# Patient Record
Sex: Female | Born: 1935
Health system: Southern US, Community
[De-identification: ages and names within clinical notes are randomized; demographics above are authoritative.]

## PROBLEM LIST (undated history)

## (undated) DIAGNOSIS — K921 Melena: Secondary | ICD-10-CM

## (undated) DIAGNOSIS — R0989 Other specified symptoms and signs involving the circulatory and respiratory systems: Secondary | ICD-10-CM

## (undated) DIAGNOSIS — I10 Essential (primary) hypertension: Secondary | ICD-10-CM

## (undated) DIAGNOSIS — E119 Type 2 diabetes mellitus without complications: Secondary | ICD-10-CM

## (undated) DIAGNOSIS — H353290 Exudative age-related macular degeneration, unspecified eye, stage unspecified: Secondary | ICD-10-CM

## (undated) DIAGNOSIS — M8580 Other specified disorders of bone density and structure, unspecified site: Secondary | ICD-10-CM

## (undated) DIAGNOSIS — F329 Major depressive disorder, single episode, unspecified: Secondary | ICD-10-CM

## (undated) DIAGNOSIS — I672 Cerebral atherosclerosis: Secondary | ICD-10-CM

## (undated) DIAGNOSIS — G459 Transient cerebral ischemic attack, unspecified: Secondary | ICD-10-CM

## (undated) DIAGNOSIS — K2900 Acute gastritis without bleeding: Secondary | ICD-10-CM

## (undated) DIAGNOSIS — R079 Chest pain, unspecified: Secondary | ICD-10-CM

## (undated) DIAGNOSIS — E785 Hyperlipidemia, unspecified: Secondary | ICD-10-CM

## (undated) DIAGNOSIS — H3561 Retinal hemorrhage, right eye: Secondary | ICD-10-CM

## (undated) HISTORY — PX: GALLBLADDER SURGERY: SHX652

## (undated) HISTORY — DX: Major depressive disorder, single episode, unspecified: F32.9

## (undated) HISTORY — DX: Exudative age-related macular degeneration, unspecified eye, stage unspecified: H35.3290

## (undated) HISTORY — PX: CATARACT EXTRACTION: SUR2

## (undated) HISTORY — DX: Hyperlipidemia, unspecified: E78.5

## (undated) HISTORY — DX: Melena: K92.1

## (undated) HISTORY — DX: Cerebral atherosclerosis: I67.2

## (undated) HISTORY — DX: Retinal hemorrhage, right eye: H35.61

## (undated) HISTORY — DX: Other specified symptoms and signs involving the circulatory and respiratory systems: R09.89

## (undated) HISTORY — PX: EYE SURGERY: SHX253

## (undated) HISTORY — DX: Type 2 diabetes mellitus without complications: E11.9

## (undated) HISTORY — DX: Acute gastritis without bleeding: K29.00

## (undated) HISTORY — DX: Chest pain, unspecified: R07.9

## (undated) HISTORY — DX: Other specified disorders of bone density and structure, unspecified site: M85.80

## (undated) HISTORY — DX: Essential (primary) hypertension: I10

---
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
## 1983-08-15 HISTORY — PX: DILATION AND CURETTAGE OF UTERUS: SHX78

## 2001-06-28 ENCOUNTER — Other Ambulatory Visit: Admission: RE | Admit: 2001-06-28 | Discharge: 2001-06-28 | Payer: Self-pay | Admitting: Obstetrics and Gynecology

## 2002-07-07 ENCOUNTER — Other Ambulatory Visit: Admission: RE | Admit: 2002-07-07 | Discharge: 2002-07-07 | Payer: Self-pay | Admitting: Obstetrics and Gynecology

## 2004-08-23 ENCOUNTER — Other Ambulatory Visit: Admission: RE | Admit: 2004-08-23 | Discharge: 2004-08-23 | Payer: Self-pay | Admitting: Obstetrics and Gynecology

## 2004-11-29 ENCOUNTER — Emergency Department (HOSPITAL_COMMUNITY): Admission: EM | Admit: 2004-11-29 | Discharge: 2004-11-29 | Payer: Self-pay | Admitting: Emergency Medicine

## 2005-01-31 ENCOUNTER — Encounter: Admission: RE | Admit: 2005-01-31 | Discharge: 2005-01-31 | Payer: Self-pay | Admitting: General Surgery

## 2005-03-15 ENCOUNTER — Encounter (INDEPENDENT_AMBULATORY_CARE_PROVIDER_SITE_OTHER): Payer: Self-pay | Admitting: *Deleted

## 2005-03-15 ENCOUNTER — Ambulatory Visit (HOSPITAL_COMMUNITY): Admission: RE | Admit: 2005-03-15 | Discharge: 2005-03-15 | Payer: Self-pay | Admitting: General Surgery

## 2005-06-14 ENCOUNTER — Ambulatory Visit (HOSPITAL_COMMUNITY): Admission: RE | Admit: 2005-06-14 | Discharge: 2005-06-14 | Payer: Self-pay | Admitting: Gastroenterology

## 2007-11-29 DIAGNOSIS — R079 Chest pain, unspecified: Secondary | ICD-10-CM

## 2007-11-29 HISTORY — DX: Chest pain, unspecified: R07.9

## 2010-07-12 ENCOUNTER — Encounter
Admission: RE | Admit: 2010-07-12 | Discharge: 2010-07-22 | Payer: Self-pay | Source: Home / Self Care | Attending: Family Medicine | Admitting: Family Medicine

## 2010-12-30 NOTE — Op Note (Signed)
NAME:  Sabrina Hooper, Sabrina Hooper                  ACCOUNT NO.:  [id number]   MEDICAL RECORD NO.:  [id number]          PATIENT TYPE:  AMB   LOCATION:  ENDO                         FACILITY:  [facility]   PHYSICIAN:  James L. Malon Kindle., M.D.DATE OF BIRTH:  July 24, 1933   DATE OF PROCEDURE:  06/14/2005  DATE OF DISCHARGE:                                 OPERATIVE REPORT   PROCEDURE:  Colonoscopy.   MEDICATIONS:  The patient received fentanyl 75 mg and Versed 8 mg for both  procedures.   INDICATIONS FOR PROCEDURE:  Heme positive stools.   SCOPE:  Pediatric adjustable scope.   DESCRIPTION OF PROCEDURE:  Following the endoscopy, the patient was turned  around and the Olympus adjustable pediatric colonoscope was inserted  following a digital exam and advanced. The prep was excellent. We were able  to easily reach the cecum. The ileocecal valve and appendiceal orifice were  seen. Slight abdominal pressure and placing the patient in the supine  position was required. The scope was withdrawn, mucosa carefully examined.  The entire colon was seen well upon withdrawal, no polyps were seen, normal  mucosal pattern, no significant diverticular disease. The scope was  withdrawn, the patient tolerated the procedure well.   ASSESSMENT:  Heme positive stool, negative colonoscopy at this time, 792.1.   PLAN:  Will recommend yearly Hemoccult and possibly repeat colonoscopy in 10  years.           ______________________________  Llana Aliment Malon Kindle., M.D.     Waldron Session  D:  06/14/2005  T:  06/14/2005  Job:  [id number]   cc:   Thelma Barge P. Modesto Charon, M.D.  Fax: [phone]

## 2010-12-30 NOTE — Op Note (Signed)
[name], Hooper NO.:  [id number]   MEDICAL RECORD NO.:  [id number]          PATIENT TYPE:  AMB   LOCATION:  DAY                          FACILITY:  [facility]   PHYSICIAN:  Leonie Man, M.D.   DATE OF BIRTH:  07-16-1933   DATE OF PROCEDURE:  03/15/2005  DATE OF DISCHARGE:                                 OPERATIVE REPORT   PREOPERATIVE DIAGNOSIS:  Chronic calculous cholecystitis.   POSTOPERATIVE DIAGNOSIS:  Chronic calculous cholecystitis.   PROCEDURE:  Laparoscopic cholecystectomy with intraoperative cholangiogram.   SURGEON:  Dr. Leonie Man.   ASSISTANT:  Dr. Lovie Chol.   ANESTHESIA:  General.   INDICATIONS FOR PROCEDURE:  The patient is a 77 year old female presenting  with recurrent symptoms of upper abdominal pain, no associated nausea and  vomiting. She on ultrasound was noted to have multiple large gallstones  within the gallbladder. These had been confirmed some years ago and on  repeat ultrasound, she is noted to have stones. Her liver function studies  are within normal limits.   The patient comes to the operating room now after the risks and potential  benefits of surgery have been discussed. All of her questions have been  answered, consent obtained for surgery.   DESCRIPTION OF PROCEDURE:  Following the induction of satisfactory general  anesthesia, the patient positioned supinely, the abdomen is prepped and  draped routinely, open laparoscopy created at the umbilicus with  insufflation of the peritoneal cavity to 14 mmHg pressure through a Hassan  cannula. The camera was inserted and visual exploration of the abdomen  carried out. Liver edges sharp, liver surfaces smooth. The anterior gastric  wall and duodenal sweep appeared to be normal. The gallbladder was  chronically scarred. The pelvic organs are not visualized.  The portions of  the large and small intestine visualized appeared nornal. Under direct  vision,  epigastric and flank ports are placed. The gallbladder is grasped  and retracted cephalad and dissection carried out in the region of the  ampulla with isolation of the cystic artery and cystic duct. The cystic duct  is traced up to the cystic duct gallbladder junction and then clipped  proximally and opened. A cystic duct cholangiogram is then carried out using  a Cook catheter which is inserted into the cystic duct and injected with one-  half strength Hypaque under fluoroscopic control. The resulting  cholangiogram showed prompt flow of contrast into the duodenum. There were  no filling defects in the common bile duct, hepatic duct, or any of the  hepatic radicals. The cystic duct catheter is removed and the cystic duct is  triply clipped and transected. The cystic artery is isolated and doubly  clipped and transected. The gallbladder dissected free from the liver bed  using electrocautery with a maintenance of hemostasis throughout the entire  course of the dissection. At the end of dissection, the gallbladder is  placed in an EndoCatch and retrieved through the umbilicus without  difficulty. The right upper quadrant is thoroughly irrigated with normal  saline and aspirated free. All areas of dissection checked  for hemostasis  and noted to be dry. Sponge, instrument and sharp counts verified. The  pneumoperitoneum allowed to deflate after trocars removed under direct  vision. Then the wounds are then closed in layers as follows:  Umbilical  wound in two layers with 0 Vicryl and 4-0 Monocryl,  epigastric and lateral flank wounds closed with 4-0 Monocryl. All wounds  reinforced with Steri-Strips and sterile dressings were applied, anesthetic  reversed. The patient removed from the operating room to the recovery room  in stable condition.       PB/MEDQ  D:  03/15/2005  T:  03/15/2005  Job:  [id number]   cc:   Maryla Morrow. Modesto Charon, M.D.  [address]  [place]  [place] [address]  Fax:  [phone]

## 2010-12-30 NOTE — Op Note (Signed)
NAME:  Sabrina Hooper, Sabrina Hooper                  ACCOUNT NO.:  [id number]   MEDICAL RECORD NO.:  [id number]          PATIENT TYPE:  AMB   LOCATION:  ENDO                         FACILITY:  [facility]   PHYSICIAN:  James L. Malon Kindle., M.D.DATE OF BIRTH:  March 27, 1933   DATE OF PROCEDURE:  06/14/2005  DATE OF DISCHARGE:                                 OPERATIVE REPORT   PROCEDURE:  Esophagogastroduodenoscopy with biopsy.   MEDICATIONS:  Please see nurse's notes, medications are not recorded on the  doctor's information sheet and separated out for both procedures.   INDICATIONS FOR PROCEDURE:  Heme positive stool.   DESCRIPTION OF PROCEDURE:  The procedure had been explained to the patient  and consent obtained. With the patient in the left lateral decubitus  position, each scope was inserted in the mouth and passed in the esophagus  with agglutination. The stomach was entered, pylorus identified and passed.  The duodenum including the bulb and second portion were seen well and was  unremarkable. The scope was withdrawn back in the stomach, mild antral  gastritis with a few erosions. Biopsy obtained for rapid urease test for  Helicobacter. No other abnormality seen. The distal and proximal esophagus  endoscopically normal. The scope withdrawn. The patient tolerated the  procedure well.   ASSESSMENT:  1.  Gastritis possibly the source of her heme positive stool, 535.40.  2.  Heme positive stool, 792.1.   PLAN:  Will recommend Prilosec OTC 20 mg daily. Will see back in the office  in 2 months.           ______________________________  Llana Aliment. Malon Kindle., M.D.     Waldron Session  D:  06/14/2005  T:  06/14/2005  Job:  [id number]   cc:   Thelma Barge P. Modesto Charon, M.D.  Fax: [phone]

## 2011-10-24 DIAGNOSIS — I672 Cerebral atherosclerosis: Secondary | ICD-10-CM

## 2011-10-24 HISTORY — DX: Cerebral atherosclerosis: I67.2

## 2012-11-19 ENCOUNTER — Encounter: Payer: Self-pay | Admitting: Pharmacist Clinician (PhC)/ Clinical Pharmacy Specialist

## 2012-11-22 ENCOUNTER — Encounter: Payer: Self-pay | Admitting: Cardiovascular Disease

## 2012-12-25 ENCOUNTER — Ambulatory Visit: Payer: Self-pay | Admitting: Cardiovascular Disease

## 2013-02-13 ENCOUNTER — Ambulatory Visit: Payer: Self-pay | Admitting: Cardiovascular Disease

## 2013-02-13 ENCOUNTER — Ambulatory Visit (INDEPENDENT_AMBULATORY_CARE_PROVIDER_SITE_OTHER): Payer: Medicare Other | Admitting: Cardiovascular Disease

## 2013-02-13 ENCOUNTER — Encounter: Payer: Self-pay | Admitting: Cardiovascular Disease

## 2013-02-13 VITALS — BP 126/64 | HR 77 | Ht 65.0 in | Wt 168.0 lb

## 2013-02-13 DIAGNOSIS — E785 Hyperlipidemia, unspecified: Secondary | ICD-10-CM | POA: Insufficient documentation

## 2013-02-13 DIAGNOSIS — E119 Type 2 diabetes mellitus without complications: Secondary | ICD-10-CM | POA: Insufficient documentation

## 2013-02-13 DIAGNOSIS — I10 Essential (primary) hypertension: Secondary | ICD-10-CM | POA: Insufficient documentation

## 2013-02-13 NOTE — Progress Notes (Signed)
02/13/2013 Sabrina Hooper   October 11, 1932  [id number]  Primary Physician MCNEILL,WENDY, MD Primary Cardiologist: Runell Gess MD Sabrina Hooper   HPI:  The patient is a very pleasant, 79 year old, mildly overweight, married Caucasian female whose husband Renae Gloss is also a patient of mine. She is a mother of 2, grandmother to 3 grandchildren. I last saw her August 23, 2010. Her risk factors include hypertension, hyperlipidemia, and noninsulin-requiring diabetes. She denies chest pain or shortness of breath. She had a negative Myoview April 2009. Recent blood work performed by Dr. Carmon Ginsberg revealed total cholesterol 134, LDL 63 and HDL of 62. She had carotid Dopplers performed in our office 10/24/11 which revealed only mild bilateral ICA stenosis, unchanged from the prior study. Since I last saw her 11/20/11 she had no symptoms. Dr. Uvaldo Rising, her PCP, follows her laboratory exam including lipid profile     Current Outpatient Prescriptions  Medication Sig Dispense Refill  . calcium carbonate (OS-CAL) 600 MG TABS Take 600 mg by mouth 2 (two) times daily with a meal.      . citalopram (CELEXA) 20 MG tablet Take 20 mg by mouth daily.      Marland Kitchen glipiZIDE (GLUCOTROL) 5 MG tablet Take 5 mg by mouth daily.      . hydrochlorothiazide (HYDRODIURIL) 25 MG tablet Take 25 mg by mouth daily.      Marland Kitchen losartan (COZAAR) 50 MG tablet Take 50 mg by mouth daily.      . metFORMIN (GLUCOPHAGE) 500 MG tablet Take 500 mg by mouth 2 (two) times daily with a meal.      . Multiple Vitamins-Minerals (PRESERVISION AREDS) TABS Take 2 tablets by mouth daily.      Marland Kitchen omeprazole (PRILOSEC) 20 MG capsule Take 20 mg by mouth daily.      . simvastatin (ZOCOR) 10 MG tablet Take 10 mg by mouth at bedtime.       No current facility-administered medications for this visit.    Allergies  Allergen Reactions  . Codeine Nausea And Vomiting and Other (See Comments)    GI upset    History   Social History  . Marital Status: Single     Spouse Name: N/A    Number of Children: N/A  . Years of Education: N/A   Occupational History  . Not on file.   Social History Main Topics  . Smoking status: Never Smoker   . Smokeless tobacco: Never Used  . Alcohol Use: No  . Drug Use: No  . Sexually Active: Not on file   Other Topics Concern  . Not on file   Social History Narrative  . No narrative on file     Review of Systems: General: negative for chills, fever, night sweats or weight changes.  Cardiovascular: negative for chest pain, dyspnea on exertion, edema, orthopnea, palpitations, paroxysmal nocturnal dyspnea or shortness of breath Dermatological: negative for rash Respiratory: negative for cough or wheezing Urologic: negative for hematuria Abdominal: negative for nausea, vomiting, diarrhea, bright red blood per rectum, melena, or hematemesis Neurologic: negative for visual changes, syncope, or dizziness All other systems reviewed and are otherwise negative except as noted above.    Blood pressure 126/64, pulse 77, height 5\' 5"  (1.651 m), weight 168 lb (76.204 kg).  General appearance: alert and no distress Neck: no adenopathy, no carotid bruit, no JVD, supple, symmetrical, trachea midline and thyroid not enlarged, symmetric, no tenderness/mass/nodules Lungs: clear to auscultation bilaterally Heart: regular rate and rhythm, S1, S2 normal, no murmur,  click, rub or gallop Extremities: extremities normal, atraumatic, no cyanosis or edema  EKG normal sinus rhythm at 77 without ST or T wave changes  ASSESSMENT AND PLAN:   Essential hypertension Well-controlled on current medications  Hyperlipidemia On statin therapy followed by primary care physician      Runell Gess MD [facility], [facility] 02/13/2013 11:18 AM

## 2013-02-13 NOTE — Assessment & Plan Note (Signed)
Well-controlled on current medications 

## 2013-02-13 NOTE — Patient Instructions (Signed)
Your physician wants you to follow-up in: 1 year. You will receive a reminder letter in the mail two months in advance. If you don't receive a letter, please call our office to schedule the follow-up appointment.  

## 2013-02-13 NOTE — Assessment & Plan Note (Signed)
On statin therapy followed by primary care physician

## 2013-10-23 ENCOUNTER — Telehealth (HOSPITAL_COMMUNITY): Payer: Self-pay | Admitting: *Deleted

## 2013-10-27 ENCOUNTER — Other Ambulatory Visit (HOSPITAL_COMMUNITY): Payer: Self-pay | Admitting: Cardiovascular Disease

## 2013-10-27 DIAGNOSIS — I6529 Occlusion and stenosis of unspecified carotid artery: Secondary | ICD-10-CM

## 2013-11-05 ENCOUNTER — Ambulatory Visit (HOSPITAL_COMMUNITY)
Admission: RE | Admit: 2013-11-05 | Discharge: 2013-11-05 | Disposition: A | Discharge status: Home / Self Care | Payer: Medicare Other | Source: Ambulatory Visit | Attending: Cardiovascular Disease | Admitting: Cardiovascular Disease

## 2013-11-05 DIAGNOSIS — I6529 Occlusion and stenosis of unspecified carotid artery: Secondary | ICD-10-CM

## 2013-11-05 NOTE — Progress Notes (Signed)
Carotid Duplex Completed. Ketsia Linebaugh, BS, RDMS, RVT  

## 2013-11-11 ENCOUNTER — Encounter: Payer: Self-pay | Admitting: *Deleted

## 2014-03-28 ENCOUNTER — Emergency Department (HOSPITAL_BASED_OUTPATIENT_CLINIC_OR_DEPARTMENT_OTHER): Payer: Medicare Other

## 2014-03-28 ENCOUNTER — Emergency Department (HOSPITAL_BASED_OUTPATIENT_CLINIC_OR_DEPARTMENT_OTHER)
Admission: EM | Admit: 2014-03-28 | Discharge: 2014-03-28 | Disposition: A | Discharge status: Home / Self Care | Payer: Medicare Other | Attending: Emergency Medicine | Admitting: Emergency Medicine

## 2014-03-28 ENCOUNTER — Encounter (HOSPITAL_BASED_OUTPATIENT_CLINIC_OR_DEPARTMENT_OTHER): Payer: Self-pay | Admitting: Emergency Medicine

## 2014-03-28 DIAGNOSIS — S6990XA Unspecified injury of unspecified wrist, hand and finger(s), initial encounter: Secondary | ICD-10-CM | POA: Diagnosis not present

## 2014-03-28 DIAGNOSIS — W1809XA Striking against other object with subsequent fall, initial encounter: Secondary | ICD-10-CM | POA: Diagnosis not present

## 2014-03-28 DIAGNOSIS — S1093XA Contusion of unspecified part of neck, initial encounter: Principal | ICD-10-CM

## 2014-03-28 DIAGNOSIS — S4980XA Other specified injuries of shoulder and upper arm, unspecified arm, initial encounter: Secondary | ICD-10-CM | POA: Insufficient documentation

## 2014-03-28 DIAGNOSIS — S6980XA Other specified injuries of unspecified wrist, hand and finger(s), initial encounter: Secondary | ICD-10-CM | POA: Diagnosis not present

## 2014-03-28 DIAGNOSIS — S0003XA Contusion of scalp, initial encounter: Secondary | ICD-10-CM | POA: Diagnosis not present

## 2014-03-28 DIAGNOSIS — Y929 Unspecified place or not applicable: Secondary | ICD-10-CM | POA: Insufficient documentation

## 2014-03-28 DIAGNOSIS — Z8673 Personal history of transient ischemic attack (TIA), and cerebral infarction without residual deficits: Secondary | ICD-10-CM | POA: Diagnosis not present

## 2014-03-28 DIAGNOSIS — Z79899 Other long term (current) drug therapy: Secondary | ICD-10-CM | POA: Insufficient documentation

## 2014-03-28 DIAGNOSIS — I10 Essential (primary) hypertension: Secondary | ICD-10-CM | POA: Insufficient documentation

## 2014-03-28 DIAGNOSIS — S0083XA Contusion of other part of head, initial encounter: Principal | ICD-10-CM | POA: Insufficient documentation

## 2014-03-28 DIAGNOSIS — Y9389 Activity, other specified: Secondary | ICD-10-CM | POA: Diagnosis not present

## 2014-03-28 DIAGNOSIS — IMO0002 Reserved for concepts with insufficient information to code with codable children: Secondary | ICD-10-CM | POA: Diagnosis present

## 2014-03-28 DIAGNOSIS — E119 Type 2 diabetes mellitus without complications: Secondary | ICD-10-CM | POA: Insufficient documentation

## 2014-03-28 DIAGNOSIS — S0081XA Abrasion of other part of head, initial encounter: Secondary | ICD-10-CM

## 2014-03-28 DIAGNOSIS — S46909A Unspecified injury of unspecified muscle, fascia and tendon at shoulder and upper arm level, unspecified arm, initial encounter: Secondary | ICD-10-CM | POA: Diagnosis not present

## 2014-03-28 DIAGNOSIS — E785 Hyperlipidemia, unspecified: Secondary | ICD-10-CM | POA: Insufficient documentation

## 2014-03-28 DIAGNOSIS — M79644 Pain in right finger(s): Secondary | ICD-10-CM

## 2014-03-28 DIAGNOSIS — W19XXXA Unspecified fall, initial encounter: Secondary | ICD-10-CM

## 2014-03-28 NOTE — Discharge Instructions (Signed)
Please wear splint and have thumb rechecked if continued pain next week.    Abrasion An abrasion is a cut or scrape of the skin. Abrasions do not extend through all layers of the skin and most heal within 10 days. It is important to care for your abrasion properly to prevent infection. CAUSES  Most abrasions are caused by falling on, or gliding across, the ground or other surface. When your skin rubs on something, the outer and inner layer of skin rubs off, causing an abrasion. DIAGNOSIS  Your caregiver will be able to diagnose an abrasion during a physical exam.  TREATMENT  Your treatment depends on how large and deep the abrasion is. Generally, your abrasion will be cleaned with water and a mild soap to remove any dirt or debris. An antibiotic ointment may be put over the abrasion to prevent an infection. A bandage (dressing) may be wrapped around the abrasion to keep it from getting dirty.  You may need a tetanus shot if:  You cannot remember when you had your last tetanus shot.  You have never had a tetanus shot.  The injury broke your skin. If you get a tetanus shot, your arm may swell, get red, and feel warm to the touch. This is common and not a problem. If you need a tetanus shot and you choose not to have one, there is a rare chance of getting tetanus. Sickness from tetanus can be serious.  HOME CARE INSTRUCTIONS   If a dressing was applied, change it at least once a day or as directed by your caregiver. If the bandage sticks, soak it off with warm water.   Wash the area with water and a mild soap to remove all the ointment 2 times a day. Rinse off the soap and pat the area dry with a clean towel.   Reapply any ointment as directed by your caregiver. This will help prevent infection and keep the bandage from sticking. Use gauze over the wound and under the dressing to help keep the bandage from sticking.   Change your dressing right away if it becomes wet or dirty.   Only  take over-the-counter or prescription medicines for pain, discomfort, or fever as directed by your caregiver.   Follow up with your caregiver within 24-48 hours for a wound check, or as directed. If you were not given a wound-check appointment, look closely at your abrasion for redness, swelling, or pus. These are signs of infection. SEEK IMMEDIATE MEDICAL CARE IF:   You have increasing pain in the wound.   You have redness, swelling, or tenderness around the wound.   You have pus coming from the wound.   You have a fever or persistent symptoms for more than 2-3 days.  You have a fever and your symptoms suddenly get worse.  You have a bad smell coming from the wound or dressing.  MAKE SURE YOU:   Understand these instructions.  Will watch your condition.  Will get help right away if you are not doing well or get worse. Document Released: 05/10/2005 Document Revised: 07/17/2012 Document Reviewed: 07/04/2011 [facility] Patient Information 2015 [place], [place]. This information is not intended to replace advice given to you by your health care provider. Make sure you discuss any questions you have with your health care provider. Fall Prevention and Home Safety Falls cause injuries and can affect all age groups. It is possible to use preventive measures to significantly decrease the likelihood of falls. There are many  simple measures which can make your home safer and prevent falls. OUTDOORS  Repair cracks and edges of walkways and driveways.  Remove high doorway thresholds.  Trim shrubbery on the main path into your home.  Have good outside lighting.  Clear walkways of tools, rocks, debris, and clutter.  Check that handrails are not broken and are securely fastened. Both sides of steps should have handrails.  Have leaves, snow, and ice cleared regularly.  Use sand or salt on walkways during winter months.  In the garage, clean up grease or oil spills. BATHROOM  Install  night lights.  Install grab bars by the toilet and in the tub and shower.  Use non-skid mats or decals in the tub or shower.  Place a plastic non-slip stool in the shower to sit on, if needed.  Keep floors dry and clean up all water on the floor immediately.  Remove soap buildup in the tub or shower on a regular basis.  Secure bath mats with non-slip, double-sided rug tape.  Remove throw rugs and tripping hazards from the floors. BEDROOMS  Install night lights.  Make sure a bedside light is easy to reach.  Do not use oversized bedding.  Keep a telephone by your bedside.  Have a firm chair with side arms to use for getting dressed.  Remove throw rugs and tripping hazards from the floor. KITCHEN  Keep handles on pots and pans turned toward the center of the stove. Use back burners when possible.  Clean up spills quickly and allow time for drying.  Avoid walking on wet floors.  Avoid hot utensils and knives.  Position shelves so they are not too high or low.  Place commonly used objects within easy reach.  If necessary, use a sturdy step stool with a grab bar when reaching.  Keep electrical cables out of the way.  Do not use floor polish or wax that makes floors slippery. If you must use wax, use non-skid floor wax.  Remove throw rugs and tripping hazards from the floor. STAIRWAYS  Never leave objects on stairs.  Place handrails on both sides of stairways and use them. Fix any loose handrails. Make sure handrails on both sides of the stairways are as long as the stairs.  Check carpeting to make sure it is firmly attached along stairs. Make repairs to worn or loose carpet promptly.  Avoid placing throw rugs at the top or bottom of stairways, or properly secure the rug with carpet tape to prevent slippage. Get rid of throw rugs, if possible.  Have an electrician put in a light switch at the top and bottom of the stairs. OTHER FALL PREVENTION TIPS  Wear  low-heel or rubber-soled shoes that are supportive and fit well. Wear closed toe shoes.  When using a stepladder, make sure it is fully opened and both spreaders are firmly locked. Do not climb a closed stepladder.  Add color or contrast paint or tape to grab bars and handrails in your home. Place contrasting color strips on first and last steps.  Learn and use mobility aids as needed. Install an electrical emergency response system.  Turn on lights to avoid dark areas. Replace light bulbs that burn out immediately. Get light switches that glow.  Arrange furniture to create clear pathways. Keep furniture in the same place.  Firmly attach carpet with non-skid or double-sided tape.  Eliminate uneven floor surfaces.  Select a carpet pattern that does not visually hide the edge of steps.  Be  aware of all pets. OTHER HOME SAFETY TIPS  Set the water temperature for 120 F (48.8 C).  Keep emergency numbers on or near the telephone.  Keep smoke detectors on every level of the home and near sleeping areas. Document Released: 07/21/2002 Document Revised: 01/30/2012 Document Reviewed: 10/20/2011 [facility] Patient Information 2015 [place], [place]. This information is not intended to replace advice given to you by your health care provider. Make sure you discuss any questions you have with your health care provider.

## 2014-03-28 NOTE — ED Notes (Signed)
Pt was dipping water out of a pool and lost her balance. When she fell, she struck her left face on a brick. Pt also c/o pain in her right arm from the upper arm down to her thumb. Pt denies LOC.

## 2014-03-28 NOTE — ED Notes (Addendum)
I completed wound care over laceration on cheek and facial abrasions. I used normal saline and "sure-clens". I carefully removed oil film from intact skin over and under laceration with gentle wipe of alcohol pad to insure steri-strip adhesion. I then applied three small steri-strips to complete.

## 2014-03-28 NOTE — ED Notes (Signed)
I was told by nurse Fannie KneeSue to apply a velcro-thumb spica device. Patient stated her comfort was greatly improved by device. Patient is dressing and ready for discharge.

## 2014-03-28 NOTE — ED Provider Notes (Signed)
CSN: [id number]     Arrival date & time 03/28/14  1759 History  This chart was scribed for Hilario Quarry, MD by Evon Slack, ED Scribe. This patient was seen in room MH12/MH12 and the patient's care was started at 6:15 PM.    Chief Complaint  Patient presents with  . Fall   Patient is a 80 y.o. female presenting with fall. The history is provided by the patient. No language interpreter was used.  Fall This is a new problem. The current episode started less than 1 hour ago. The problem occurs rarely. The problem has not changed since onset.Nothing aggravates the symptoms. Nothing relieves the symptoms. She has tried a cold compress for the symptoms.   HPI Comments: Sabrina Hooper is a 80 y.o. female who presents to the Emergency Department complaining of Fall onset PTA. She has associated right arm pain, facial pain and facial abrasion. She denies LOC. She states she was pouring water out when she lost her balance and fell and hit her head on a brick. She states she may have hurt her arm on the concrete when she fell. She states there was no witness to the event. She states that her tetanus is UTD   Past Medical History  Diagnosis Date  . Chest pain, unspecified 11/29/2007    R/S MV - EF 94%; normal perfusion in all regions, global LV function normal, EKG negative for ischemia;   . Cerebral atherosclerosis 10/24/2011    Doppler - bilateral bulb/proximal ICAs all demonstrated trace to mild amts fibrous plaque w/ no evidence of significant diameter reduction, dissection or other vascular abnormality  . Hypertension   . Hyperlipidemia   . Type 2 diabetes mellitus    Past Surgical History  Procedure Laterality Date  . Dilation and curettage of uterus  1985  . Cataract extraction  07/2004, 08/2004   Family History  Problem Relation Age of Onset  . Kidney disease Brother   . Heart disease Brother   . Heart attack Brother    History  Substance Use Topics  . Smoking status: Never Smoker    . Smokeless tobacco: Never Used  . Alcohol Use: No   OB History   Grav Para Term Preterm Abortions TAB SAB Ect Mult Living                 Review of Systems  Musculoskeletal: Positive for arthralgias.  Skin: Positive for wound.  Neurological: Negative for syncope.  All other systems reviewed and are negative.   Allergies  Codeine  Home Medications   Prior to Admission medications   Medication Sig Start Date End Date Taking? Authorizing Provider  calcium carbonate (OS-CAL) 600 MG TABS Take 600 mg by mouth 2 (two) times daily with a meal.    Historical Provider, MD  citalopram (CELEXA) 20 MG tablet Take 20 mg by mouth daily.    Historical Provider, MD  glipiZIDE (GLUCOTROL) 5 MG tablet Take 5 mg by mouth daily.    Historical Provider, MD  hydrochlorothiazide (HYDRODIURIL) 25 MG tablet Take 25 mg by mouth daily.    Historical Provider, MD  losartan (COZAAR) 50 MG tablet Take 50 mg by mouth daily.    Historical Provider, MD  metFORMIN (GLUCOPHAGE) 500 MG tablet Take 500 mg by mouth 2 (two) times daily with a meal.    Historical Provider, MD  Multiple Vitamins-Minerals (PRESERVISION AREDS) TABS Take 2 tablets by mouth daily.    Historical Provider, MD  omeprazole (PRILOSEC) 20  MG capsule Take 20 mg by mouth daily.    Historical Provider, MD  simvastatin (ZOCOR) 10 MG tablet Take 10 mg by mouth at bedtime.    Historical Provider, MD   Triage Vitals: BP 167/83  Pulse 89  Temp(Src) 97.9 F (36.6 C) (Oral)  Resp 18  Ht 5\' 6"  (1.676 m)  Wt 160 lb (72.576 kg)  BMI 25.84 kg/m2  SpO2 97%  Physical Exam  Nursing note and vitals reviewed. Constitutional: She is oriented to person, place, and time. She appears well-developed and well-nourished.  HENT:  Head: Head is with abrasion and with contusion.  Right Ear: External ear normal.  Left Ear: External ear normal.  Nose: Nose normal.  Mouth/Throat: Oropharynx is clear and moist.  contusion and abrasion left periorbital over bridge  of nose, 3cm skin tear under left eye  Eyes: Conjunctivae and EOM are normal. Pupils are equal, round, and reactive to light.  Neck: Normal range of motion. Neck supple.  Cardiovascular: Normal rate, regular rhythm, normal heart sounds and intact distal pulses.   Pulmonary/Chest: Effort normal and breath sounds normal.  Abdominal: Soft. Bowel sounds are normal.  Musculoskeletal: Normal range of motion. She exhibits tenderness.  Tenderness to palpation over MCP joint of right thumb with tenderness to palpation to distal forearm and radial aspect of right elbow, Full active ROM in elbow and wrist,  pain with flexion of right thumb, Fingers with full ROM.  Neurological: She is alert and oriented to person, place, and time. She has normal reflexes.  Skin: Skin is warm and dry.  Psychiatric: She has a normal mood and affect. Her behavior is normal. Judgment and thought content normal.    ED Course  Procedures (including critical care time) DIAGNOSTIC STUDIES: Oxygen Saturation is 97% on RA, normal by my interpretation.    COORDINATION OF CARE: 6:24 PM-Discussed treatment plan which includes CT of head, X-rays of right elbow, wrist and hand with pt at bedside and pt agreed to plan.     Labs Review Labs Reviewed - No data to display  Imaging Review Dg Elbow Complete Right  03/28/2014   CLINICAL DATA:  Fall, elbow pain.  EXAM: RIGHT ELBOW - COMPLETE 3+ VIEW  COMPARISON:  None.  FINDINGS: There is no evidence of fracture, dislocation, or joint effusion. There is no evidence of arthropathy or other focal bone abnormality. Soft tissues are unremarkable.  IMPRESSION: Negative.   Electronically Signed   By: Charlett NoseKevin  Dover M.D.   On: 03/28/2014 19:07   Dg Wrist Complete Right  03/28/2014   CLINICAL DATA:  Fall, wrist and hand pain.  EXAM: RIGHT WRIST - COMPLETE 3+ VIEW  COMPARISON:  None.  FINDINGS: No acute bony abnormality. Specifically, no fracture, subluxation, or dislocation. Soft tissues are  intact. Mild osteoarthritic changes in the wrist joints and first carpometacarpal joint.  IMPRESSION: No acute bony abnormality.   Electronically Signed   By: Charlett NoseKevin  Dover M.D.   On: 03/28/2014 19:08   Ct Head Wo Contrast  03/28/2014   CLINICAL DATA:  Fall, trauma, facial pain,  EXAM: CT HEAD WITHOUT CONTRAST  CT MAXILLOFACIAL WITHOUT CONTRAST  TECHNIQUE: Multidetector CT imaging of the head and maxillofacial structures were performed using the standard protocol without intravenous contrast. Multiplanar CT image reconstructions of the maxillofacial structures were also generated.  COMPARISON:  None.  FINDINGS: CT HEAD FINDINGS  No intracranial hemorrhage. No parenchymal contusion. No midline shift or mass effect. Basilar cisterns are patent. No skull base fracture. No  fluid in the paranasal sinuses or mastoid air cells. Orbits are normal.  There is mild periventricular subcortical white matter hypodensities. Mild atrophy.  CT MAXILLOFACIAL FINDINGS  Orbital rim rims are intact. The globes and intraconal contents are normal. Zygomatic arches are intact. No fracture of the maxilla or pterygoid plates. No fluid in sinuses. Nasal bones are intact. The mandibular condyles are located. No evidence mandible fracture.  IMPRESSION: 1. No intracranial trauma. 2. Mild atrophy and microvascular disease. 3. No facial bone fracture   Electronically Signed   By: Genevive Bi M.D.   On: 03/28/2014 19:02   Dg Hand Complete Right  03/28/2014   CLINICAL DATA:  Fall, hand pain  EXAM: RIGHT HAND - COMPLETE 3+ VIEW  COMPARISON:  None.  FINDINGS: Mild osteoarthritic changes in the IP joints and first carpometacarpal joint. No acute bony abnormality. Specifically, no fracture, subluxation, or dislocation. Soft tissues are intact.  IMPRESSION: No acute bony abnormality.   Electronically Signed   By: Charlett Nose M.D.   On: 03/28/2014 19:08   Ct Maxillofacial Wo Cm  03/28/2014   CLINICAL DATA:  Fall, trauma, facial pain,  EXAM:  CT HEAD WITHOUT CONTRAST  CT MAXILLOFACIAL WITHOUT CONTRAST  TECHNIQUE: Multidetector CT imaging of the head and maxillofacial structures were performed using the standard protocol without intravenous contrast. Multiplanar CT image reconstructions of the maxillofacial structures were also generated.  COMPARISON:  None.  FINDINGS: CT HEAD FINDINGS  No intracranial hemorrhage. No parenchymal contusion. No midline shift or mass effect. Basilar cisterns are patent. No skull base fracture. No fluid in the paranasal sinuses or mastoid air cells. Orbits are normal.  There is mild periventricular subcortical white matter hypodensities. Mild atrophy.  CT MAXILLOFACIAL FINDINGS  Orbital rim rims are intact. The globes and intraconal contents are normal. Zygomatic arches are intact. No fracture of the maxilla or pterygoid plates. No fluid in sinuses. Nasal bones are intact. The mandibular condyles are located. No evidence mandible fracture.  IMPRESSION: 1. No intracranial trauma. 2. Mild atrophy and microvascular disease. 3. No facial bone fracture   Electronically Signed   By: Genevive Bi M.D.   On: 03/28/2014 19:02     EKG Interpretation None      MDM   Final diagnoses:  Facial abrasion, initial encounter  Thumb pain, right  Fall, initial encounter     80 y.o female with mechanical fall.  No ic trauma, face with contusion but no fractures on ct,  Right thumb is ttp but full arom, no fx seen on plain films.  Splint placed and patient advised to follow up if not better.  Return precautions given and patient and family voice understanding.   I personally performed the services described in this documentation, which was scribed in my presence. The recorded information has been reviewed and considered.        Hilario Quarry, MD 03/30/14 [phone]

## 2015-10-14 DIAGNOSIS — L821 Other seborrheic keratosis: Secondary | ICD-10-CM | POA: Diagnosis not present

## 2015-10-14 DIAGNOSIS — L570 Actinic keratosis: Secondary | ICD-10-CM | POA: Diagnosis not present

## 2015-10-14 DIAGNOSIS — Z85828 Personal history of other malignant neoplasm of skin: Secondary | ICD-10-CM | POA: Diagnosis not present

## 2015-11-01 DIAGNOSIS — H353133 Nonexudative age-related macular degeneration, bilateral, advanced atrophic without subfoveal involvement: Secondary | ICD-10-CM | POA: Diagnosis not present

## 2015-11-01 DIAGNOSIS — H353221 Exudative age-related macular degeneration, left eye, with active choroidal neovascularization: Secondary | ICD-10-CM | POA: Diagnosis not present

## 2015-11-17 DIAGNOSIS — E782 Mixed hyperlipidemia: Secondary | ICD-10-CM | POA: Diagnosis not present

## 2015-11-17 DIAGNOSIS — Z7189 Other specified counseling: Secondary | ICD-10-CM | POA: Diagnosis not present

## 2015-11-17 DIAGNOSIS — I10 Essential (primary) hypertension: Secondary | ICD-10-CM | POA: Diagnosis not present

## 2015-11-17 DIAGNOSIS — Z0000 Encounter for general adult medical examination without abnormal findings: Secondary | ICD-10-CM | POA: Diagnosis not present

## 2015-11-17 DIAGNOSIS — N958 Other specified menopausal and perimenopausal disorders: Secondary | ICD-10-CM | POA: Diagnosis not present

## 2015-11-17 DIAGNOSIS — Z7984 Long term (current) use of oral hypoglycemic drugs: Secondary | ICD-10-CM | POA: Diagnosis not present

## 2015-11-17 DIAGNOSIS — Z01419 Encounter for gynecological examination (general) (routine) without abnormal findings: Secondary | ICD-10-CM | POA: Diagnosis not present

## 2015-11-17 DIAGNOSIS — F3342 Major depressive disorder, recurrent, in full remission: Secondary | ICD-10-CM | POA: Diagnosis not present

## 2015-11-17 DIAGNOSIS — K219 Gastro-esophageal reflux disease without esophagitis: Secondary | ICD-10-CM | POA: Diagnosis not present

## 2015-11-17 DIAGNOSIS — E11319 Type 2 diabetes mellitus with unspecified diabetic retinopathy without macular edema: Secondary | ICD-10-CM | POA: Diagnosis not present

## 2015-12-28 DIAGNOSIS — N6489 Other specified disorders of breast: Secondary | ICD-10-CM | POA: Diagnosis not present

## 2015-12-28 DIAGNOSIS — Z780 Asymptomatic menopausal state: Secondary | ICD-10-CM | POA: Diagnosis not present

## 2015-12-29 ENCOUNTER — Other Ambulatory Visit: Payer: Self-pay | Admitting: Radiology

## 2015-12-29 DIAGNOSIS — N641 Fat necrosis of breast: Secondary | ICD-10-CM | POA: Diagnosis not present

## 2015-12-29 DIAGNOSIS — N63 Unspecified lump in breast: Secondary | ICD-10-CM | POA: Diagnosis not present

## 2015-12-29 DIAGNOSIS — Z780 Asymptomatic menopausal state: Secondary | ICD-10-CM | POA: Diagnosis not present

## 2016-01-11 DIAGNOSIS — H353133 Nonexudative age-related macular degeneration, bilateral, advanced atrophic without subfoveal involvement: Secondary | ICD-10-CM | POA: Diagnosis not present

## 2016-01-11 DIAGNOSIS — H353212 Exudative age-related macular degeneration, right eye, with inactive choroidal neovascularization: Secondary | ICD-10-CM | POA: Diagnosis not present

## 2016-01-11 DIAGNOSIS — H353221 Exudative age-related macular degeneration, left eye, with active choroidal neovascularization: Secondary | ICD-10-CM | POA: Diagnosis not present

## 2016-04-11 DIAGNOSIS — H353221 Exudative age-related macular degeneration, left eye, with active choroidal neovascularization: Secondary | ICD-10-CM | POA: Diagnosis not present

## 2016-05-18 DIAGNOSIS — K219 Gastro-esophageal reflux disease without esophagitis: Secondary | ICD-10-CM | POA: Diagnosis not present

## 2016-05-18 DIAGNOSIS — E11319 Type 2 diabetes mellitus with unspecified diabetic retinopathy without macular edema: Secondary | ICD-10-CM | POA: Diagnosis not present

## 2016-05-18 DIAGNOSIS — J309 Allergic rhinitis, unspecified: Secondary | ICD-10-CM | POA: Diagnosis not present

## 2016-05-18 DIAGNOSIS — E782 Mixed hyperlipidemia: Secondary | ICD-10-CM | POA: Diagnosis not present

## 2016-05-18 DIAGNOSIS — H353230 Exudative age-related macular degeneration, bilateral, stage unspecified: Secondary | ICD-10-CM | POA: Diagnosis not present

## 2016-05-18 DIAGNOSIS — Z7984 Long term (current) use of oral hypoglycemic drugs: Secondary | ICD-10-CM | POA: Diagnosis not present

## 2016-05-18 DIAGNOSIS — Z23 Encounter for immunization: Secondary | ICD-10-CM | POA: Diagnosis not present

## 2016-05-18 DIAGNOSIS — I10 Essential (primary) hypertension: Secondary | ICD-10-CM | POA: Diagnosis not present

## 2016-05-18 DIAGNOSIS — F3342 Major depressive disorder, recurrent, in full remission: Secondary | ICD-10-CM | POA: Diagnosis not present

## 2016-07-11 DIAGNOSIS — H353221 Exudative age-related macular degeneration, left eye, with active choroidal neovascularization: Secondary | ICD-10-CM | POA: Diagnosis not present

## 2016-10-02 DIAGNOSIS — H353221 Exudative age-related macular degeneration, left eye, with active choroidal neovascularization: Secondary | ICD-10-CM | POA: Diagnosis not present

## 2016-11-23 DIAGNOSIS — R921 Mammographic calcification found on diagnostic imaging of breast: Secondary | ICD-10-CM | POA: Diagnosis not present

## 2016-11-28 DIAGNOSIS — Z0000 Encounter for general adult medical examination without abnormal findings: Secondary | ICD-10-CM | POA: Diagnosis not present

## 2016-11-28 DIAGNOSIS — E113293 Type 2 diabetes mellitus with mild nonproliferative diabetic retinopathy without macular edema, bilateral: Secondary | ICD-10-CM | POA: Diagnosis not present

## 2016-11-28 DIAGNOSIS — E139 Other specified diabetes mellitus without complications: Secondary | ICD-10-CM | POA: Diagnosis not present

## 2016-11-28 DIAGNOSIS — N958 Other specified menopausal and perimenopausal disorders: Secondary | ICD-10-CM | POA: Diagnosis not present

## 2016-12-01 DIAGNOSIS — Z7984 Long term (current) use of oral hypoglycemic drugs: Secondary | ICD-10-CM | POA: Diagnosis not present

## 2016-12-01 DIAGNOSIS — E11319 Type 2 diabetes mellitus with unspecified diabetic retinopathy without macular edema: Secondary | ICD-10-CM | POA: Diagnosis not present

## 2016-12-11 DIAGNOSIS — E113213 Type 2 diabetes mellitus with mild nonproliferative diabetic retinopathy with macular edema, bilateral: Secondary | ICD-10-CM | POA: Diagnosis not present

## 2016-12-11 DIAGNOSIS — I10 Essential (primary) hypertension: Secondary | ICD-10-CM | POA: Diagnosis not present

## 2016-12-11 DIAGNOSIS — E782 Mixed hyperlipidemia: Secondary | ICD-10-CM | POA: Diagnosis not present

## 2016-12-11 DIAGNOSIS — K219 Gastro-esophageal reflux disease without esophagitis: Secondary | ICD-10-CM | POA: Diagnosis not present

## 2016-12-27 DIAGNOSIS — H353212 Exudative age-related macular degeneration, right eye, with inactive choroidal neovascularization: Secondary | ICD-10-CM | POA: Diagnosis not present

## 2016-12-27 DIAGNOSIS — E119 Type 2 diabetes mellitus without complications: Secondary | ICD-10-CM | POA: Diagnosis not present

## 2016-12-27 DIAGNOSIS — H353133 Nonexudative age-related macular degeneration, bilateral, advanced atrophic without subfoveal involvement: Secondary | ICD-10-CM | POA: Diagnosis not present

## 2016-12-27 DIAGNOSIS — H353221 Exudative age-related macular degeneration, left eye, with active choroidal neovascularization: Secondary | ICD-10-CM | POA: Diagnosis not present

## 2017-01-11 DIAGNOSIS — Z85828 Personal history of other malignant neoplasm of skin: Secondary | ICD-10-CM | POA: Diagnosis not present

## 2017-01-11 DIAGNOSIS — L821 Other seborrheic keratosis: Secondary | ICD-10-CM | POA: Diagnosis not present

## 2017-01-11 DIAGNOSIS — L570 Actinic keratosis: Secondary | ICD-10-CM | POA: Diagnosis not present

## 2017-01-11 DIAGNOSIS — H61001 Unspecified perichondritis of right external ear: Secondary | ICD-10-CM | POA: Diagnosis not present

## 2017-04-23 DIAGNOSIS — H3561 Retinal hemorrhage, right eye: Secondary | ICD-10-CM | POA: Diagnosis not present

## 2017-04-23 DIAGNOSIS — H353221 Exudative age-related macular degeneration, left eye, with active choroidal neovascularization: Secondary | ICD-10-CM | POA: Diagnosis not present

## 2017-04-23 DIAGNOSIS — H353133 Nonexudative age-related macular degeneration, bilateral, advanced atrophic without subfoveal involvement: Secondary | ICD-10-CM | POA: Diagnosis not present

## 2017-04-23 DIAGNOSIS — H353211 Exudative age-related macular degeneration, right eye, with active choroidal neovascularization: Secondary | ICD-10-CM | POA: Diagnosis not present

## 2017-06-04 DIAGNOSIS — H353212 Exudative age-related macular degeneration, right eye, with inactive choroidal neovascularization: Secondary | ICD-10-CM | POA: Diagnosis not present

## 2017-06-04 DIAGNOSIS — H353222 Exudative age-related macular degeneration, left eye, with inactive choroidal neovascularization: Secondary | ICD-10-CM | POA: Diagnosis not present

## 2017-06-04 DIAGNOSIS — H3561 Retinal hemorrhage, right eye: Secondary | ICD-10-CM | POA: Diagnosis not present

## 2017-06-04 DIAGNOSIS — H353133 Nonexudative age-related macular degeneration, bilateral, advanced atrophic without subfoveal involvement: Secondary | ICD-10-CM | POA: Diagnosis not present

## 2017-06-12 DIAGNOSIS — E782 Mixed hyperlipidemia: Secondary | ICD-10-CM | POA: Diagnosis not present

## 2017-06-12 DIAGNOSIS — I10 Essential (primary) hypertension: Secondary | ICD-10-CM | POA: Diagnosis not present

## 2017-06-12 DIAGNOSIS — E1165 Type 2 diabetes mellitus with hyperglycemia: Secondary | ICD-10-CM | POA: Diagnosis not present

## 2017-06-12 DIAGNOSIS — Z0000 Encounter for general adult medical examination without abnormal findings: Secondary | ICD-10-CM | POA: Diagnosis not present

## 2017-09-10 DIAGNOSIS — H353212 Exudative age-related macular degeneration, right eye, with inactive choroidal neovascularization: Secondary | ICD-10-CM | POA: Diagnosis not present

## 2017-09-10 DIAGNOSIS — H3561 Retinal hemorrhage, right eye: Secondary | ICD-10-CM | POA: Diagnosis not present

## 2017-09-10 DIAGNOSIS — H353222 Exudative age-related macular degeneration, left eye, with inactive choroidal neovascularization: Secondary | ICD-10-CM | POA: Diagnosis not present

## 2017-09-10 DIAGNOSIS — H353133 Nonexudative age-related macular degeneration, bilateral, advanced atrophic without subfoveal involvement: Secondary | ICD-10-CM | POA: Diagnosis not present

## 2017-12-17 DIAGNOSIS — E782 Mixed hyperlipidemia: Secondary | ICD-10-CM | POA: Diagnosis not present

## 2017-12-17 DIAGNOSIS — I10 Essential (primary) hypertension: Secondary | ICD-10-CM | POA: Diagnosis not present

## 2017-12-17 DIAGNOSIS — H3530 Unspecified macular degeneration: Secondary | ICD-10-CM | POA: Diagnosis not present

## 2017-12-17 DIAGNOSIS — E11319 Type 2 diabetes mellitus with unspecified diabetic retinopathy without macular edema: Secondary | ICD-10-CM | POA: Diagnosis not present

## 2018-01-04 DIAGNOSIS — S0083XA Contusion of other part of head, initial encounter: Secondary | ICD-10-CM | POA: Diagnosis not present

## 2018-01-08 DIAGNOSIS — B029 Zoster without complications: Secondary | ICD-10-CM | POA: Diagnosis not present

## 2018-01-08 DIAGNOSIS — Z85828 Personal history of other malignant neoplasm of skin: Secondary | ICD-10-CM | POA: Diagnosis not present

## 2018-01-10 DIAGNOSIS — H353222 Exudative age-related macular degeneration, left eye, with inactive choroidal neovascularization: Secondary | ICD-10-CM | POA: Diagnosis not present

## 2018-01-10 DIAGNOSIS — B0230 Zoster ocular disease, unspecified: Secondary | ICD-10-CM | POA: Diagnosis not present

## 2018-01-10 DIAGNOSIS — H353133 Nonexudative age-related macular degeneration, bilateral, advanced atrophic without subfoveal involvement: Secondary | ICD-10-CM | POA: Diagnosis not present

## 2018-01-10 DIAGNOSIS — R55 Syncope and collapse: Secondary | ICD-10-CM | POA: Diagnosis not present

## 2018-01-10 DIAGNOSIS — H3561 Retinal hemorrhage, right eye: Secondary | ICD-10-CM | POA: Diagnosis not present

## 2018-01-10 DIAGNOSIS — Z7984 Long term (current) use of oral hypoglycemic drugs: Secondary | ICD-10-CM | POA: Diagnosis not present

## 2018-01-10 DIAGNOSIS — I4510 Unspecified right bundle-branch block: Secondary | ICD-10-CM | POA: Diagnosis not present

## 2018-01-10 DIAGNOSIS — E1165 Type 2 diabetes mellitus with hyperglycemia: Secondary | ICD-10-CM | POA: Diagnosis not present

## 2018-01-15 DIAGNOSIS — H61001 Unspecified perichondritis of right external ear: Secondary | ICD-10-CM | POA: Diagnosis not present

## 2018-01-15 DIAGNOSIS — L821 Other seborrheic keratosis: Secondary | ICD-10-CM | POA: Diagnosis not present

## 2018-01-15 DIAGNOSIS — Z85828 Personal history of other malignant neoplasm of skin: Secondary | ICD-10-CM | POA: Diagnosis not present

## 2018-01-15 DIAGNOSIS — L858 Other specified epidermal thickening: Secondary | ICD-10-CM | POA: Diagnosis not present

## 2018-02-05 DIAGNOSIS — R413 Other amnesia: Secondary | ICD-10-CM | POA: Diagnosis not present

## 2018-02-05 DIAGNOSIS — R55 Syncope and collapse: Secondary | ICD-10-CM | POA: Diagnosis not present

## 2018-02-13 NOTE — Progress Notes (Signed)
Cardiology Office Note:    Date:  02/18/2018   ID:  Sabrina Hooper, DOB 1933-02-21, MRN [id number]  PCP:  Gweneth Dimitri, MD  Cardiologist:  Thurmon Fair, MD , previously Dr. Allyson Sabal in 2015  Referring MD: Gweneth Dimitri, MD   Chief Complaint  Patient presents with  . Follow-up  . Dizziness    constantly    History of Present Illness:    Sabrina Hooper is a 84 y.o. female with a hx of HTN, HLD, DM, GERD, and depression. She presents today for a new patient evaluation for an episode of near-syncope and new RBBB.  She was referred by [facility] after a near syncopal episode 12/2017. She nearly passed out after going to the eye doctor and [employer] with her daughter. She had good PO intake that day. She was able to eat 1.5 hrs after episode. She saw her PCP. EKG at that visit with incomplete RBBB and labs significant for hypokalemia of 3.1. A1c was 7.6%.   She presents today and is here with her daughter.  She had shingles around 12/31/2017.  These have largely resolved.  However, following this infection she started having periods of dizziness, lightheadedness, and near syncope.  She and her daughter reports very poor p.o. intake.  She only drinks Diet Coke and is not eating at least 3 times a day.  Patient reports having dizziness approximately 4-5 times a week.  She has never completely lost consciousness.  However she describes 2 episodes of near syncope.  Of note, she went away with her son to the beach for 1 week.  Family make sure that she ate 3 times a day and she was hydrated and ate lots of fruit.  She had no dizziness spells during this time.  She denies chest pain, palpitations, lower extremity swelling, and shortness of breath.  EKG here with incomplete RBBB and ST depression and TWI in anterior septal leads. Orthostatic vitals are negative for hypotension.    Past Medical History:  Diagnosis Date  . Cerebral atherosclerosis 10/24/2011   Doppler - bilateral bulb/proximal ICAs all  demonstrated trace to mild amts fibrous plaque w/ no evidence of significant diameter reduction, dissection or other vascular abnormality  . Chest pain, unspecified 11/29/2007   R/S MV - EF 94%; normal perfusion in all regions, global LV function normal, EKG negative for ischemia;   . Hyperlipidemia   . Hypertension   . Type 2 diabetes mellitus (HCC)     Past Surgical History:  Procedure Laterality Date  . CATARACT EXTRACTION  07/2004, 08/2004  . DILATION AND CURETTAGE OF UTERUS  1985    Current Medications: Current Meds  Medication Sig  . calcium carbonate (OS-CAL) 600 MG TABS Take 600 mg by mouth 2 (two) times daily with a meal.  . citalopram (CELEXA) 20 MG tablet Take 20 mg by mouth daily.  Marland Kitchen glipiZIDE (GLUCOTROL) 5 MG tablet Take 5 mg by mouth daily.  Marland Kitchen losartan (COZAAR) 50 MG tablet Take 50 mg by mouth daily.  . metFORMIN (GLUCOPHAGE) 500 MG tablet Take 500 mg by mouth 2 (two) times daily with a meal.  . omeprazole (PRILOSEC) 20 MG capsule Take 20 mg by mouth daily.  . simvastatin (ZOCOR) 10 MG tablet Take 10 mg by mouth at bedtime.  . [DISCONTINUED] hydrochlorothiazide (HYDRODIURIL) 25 MG tablet Take 25 mg by mouth daily.  . [DISCONTINUED] Multiple Vitamins-Minerals (PRESERVISION AREDS) TABS Take 2 tablets by mouth daily.     Allergies:   Codeine  Social History   Socioeconomic History  . Marital status: Single    Spouse name: Not on file  . Number of children: Not on file  . Years of education: Not on file  . Highest education level: Not on file  Occupational History  . Not on file  Social Needs  . Financial resource strain: Not on file  . Food insecurity:    Worry: Not on file    Inability: Not on file  . Transportation needs:    Medical: Not on file    Non-medical: Not on file  Tobacco Use  . Smoking status: Never Smoker  . Smokeless tobacco: Never Used  Substance and Sexual Activity  . Alcohol use: No  . Drug use: No  . Sexual activity: Not on file    Lifestyle  . Physical activity:    Days per week: Not on file    Minutes per session: Not on file  . Stress: Not on file  Relationships  . Social connections:    Talks on phone: Not on file    Gets together: Not on file    Attends religious service: Not on file    Active member of club or organization: Not on file    Attends meetings of clubs or organizations: Not on file    Relationship status: Not on file  Other Topics Concern  . Not on file  Social History Narrative  . Not on file     Family History: The patient's family history includes Heart attack in her brother; Heart disease in her brother; Kidney disease in her brother.  ROS:   Please see the history of present illness.     All other systems reviewed and are negative.  EKGs/Labs/Other Studies Reviewed:    The following studies were reviewed today:  none  EKG:  EKG is ordered today.  The ekg ordered today demonstrates incomplete RBBB.   Recent Labs: No results found for requested labs within last 8760 hours.  Recent Lipid Panel No results found for: CHOL, TRIG, HDL, CHOLHDL, VLDL, LDLCALC, LDLDIRECT  Physical Exam:    VS:  BP 116/71   Pulse 93   Ht 5\' 5"  (1.651 m)   Wt 154 lb (69.9 kg)   BMI 25.63 kg/m     Wt Readings from Last 3 Encounters:  02/18/18 154 lb (69.9 kg)  03/28/14 160 lb (72.6 kg)  02/13/13 168 lb (76.2 kg)     GEN: Well nourished, well developed in no acute distress HEENT: Normal NECK: No JVD; No carotid bruits CARDIAC: RRR, no murmurs, rubs, gallops RESPIRATORY:  Clear to auscultation without rales, wheezing or rhonchi  ABDOMEN: Soft, non-tender, non-distended MUSCULOSKELETAL:  No edema; No deformity  SKIN: Warm and dry NEUROLOGIC:  Alert and oriented x 3 PSYCHIATRIC:  Normal affect   ASSESSMENT:    1. Dizziness   2. Lightheadedness   3. Near syncope   4. Hypokalemia   5. Essential hypertension   6. Hyperlipidemia, unspecified hyperlipidemia type    PLAN:    In  order of problems listed above:  Dizziness, Lightheadedness, Near syncope Given the patient's history, I suspect that the symptoms are due to poor p.o. intake. When she is provided meals daily and hydrates, she does not have dizziness.  Orthostatic vitals were negative for hypotension today.  EKG with new incomplete right bundle branch block, which may be age-related.  Will obtain TSH as this has not been checked recently.  We will also obtain repeat BMP  for previous hypokalemia of 3.1.  Because of suspected dehydration, I will stop HCTZ.  Continue losartan at 50 mg daily.  She was instructed to keep a blood pressure log daily.  Will obtain echocardiogram and repeat carotid ultrasound. I will see her back in 1.5 weeks.  If she continues to have symptoms, I will place an event monitor.  Hypokalemia Repeat basic metabolic panel.  Encourage high potassium foods.  Essential hypertension Medication changes as above. She will keep a daily BP log.  Hyperlipidemia, unspecified hyperlipidemia type Last lipids with PCP 12/17/17: tot: 146, HDL 48, LDL 63, Trig 172 Continue current statin.    I will see her back in 1.5 weeks to review labs and check BP. Case discussed with Dr. Royann Shiversroitoru. If she continues to have symptoms will place event monitor.   Medication Adjustments/Labs and Tests Ordered: Current medicines are reviewed at length with the patient today.  Concerns regarding medicines are outlined above.  Orders Placed This Encounter  Procedures  . TSH  . Basic metabolic panel  . EKG 12-Lead  . ECHOCARDIOGRAM COMPLETE   No orders of the defined types were placed in this encounter.   Signed, Roe Rutherfordngela Nicole Duke, PA  02/18/2018 4:11 PM    [facility]

## 2018-02-18 ENCOUNTER — Encounter: Payer: Self-pay | Admitting: Physician Assistant

## 2018-02-18 ENCOUNTER — Ambulatory Visit: Payer: Medicare Other | Admitting: Physician Assistant

## 2018-02-18 VITALS — BP 116/71 | HR 93 | Ht 65.0 in | Wt 154.0 lb

## 2018-02-18 DIAGNOSIS — E876 Hypokalemia: Secondary | ICD-10-CM | POA: Diagnosis not present

## 2018-02-18 DIAGNOSIS — I10 Essential (primary) hypertension: Secondary | ICD-10-CM | POA: Diagnosis not present

## 2018-02-18 DIAGNOSIS — R42 Dizziness and giddiness: Secondary | ICD-10-CM | POA: Diagnosis not present

## 2018-02-18 DIAGNOSIS — E785 Hyperlipidemia, unspecified: Secondary | ICD-10-CM

## 2018-02-18 DIAGNOSIS — R55 Syncope and collapse: Secondary | ICD-10-CM | POA: Diagnosis not present

## 2018-02-18 NOTE — Patient Instructions (Signed)
Medication Instructions: STOP the hydrochlorothiazide.  If you need a refill on your cardiac medications before your next appointment, please call your pharmacy.   Labwork: Your provider would like for you to have the following labs today: BMET and TSH   Procedures/Testing: Your physician has requested that you have a carotid duplex. This test is an ultrasound of the carotid arteries in your neck. It looks at blood flow through these arteries that supply the brain with blood. Allow one hour for this exam. There are no restrictions or special instructions. This will take place at [address], [address]  Your physician has requested that you have an echocardiogram. Echocardiography is a painless test that uses sound waves to create images of your heart. It provides your doctor with information about the size and shape of your heart and how well your heart's chambers and valves are working. This procedure takes approximately one hour. There are no restrictions for this procedure. This will take place at [address], [address].    Follow-Up: Your physician wants you to follow-up in 2 weeks with Sabrina FlesherAngela Duke, PA after the doppler and echo have been completed.  Special Instructions: Please keep a daily log of your blood pressures and bring them to your next appointment Please be sure to eat three meals a day and have snacks throughout the day. Increase your hydration/fluids   [facility]

## 2018-02-19 LAB — BASIC METABOLIC PANEL
BUN/Creatinine Ratio: 16 (ref 12–28)
BUN: 15 mg/dL (ref 8–27)
CO2: 23 mmol/L (ref 20–29)
Calcium: 9.6 mg/dL (ref 8.7–10.3)
Chloride: 95 mmol/L — ABNORMAL LOW (ref 96–106)
Creatinine, Ser: 0.95 mg/dL (ref 0.57–1.00)
GFR calc Af Amer: 64 mL/min/{1.73_m2} (ref >59)
GFR calc non Af Amer: 56 mL/min/{1.73_m2} — ABNORMAL LOW (ref >59)
Glucose: 146 mg/dL — ABNORMAL HIGH (ref 65–99)
Potassium: 4.5 mmol/L (ref 3.5–5.2)
Sodium: 137 mmol/L (ref 134–144)

## 2018-02-19 LAB — TSH: TSH: 6.58 u[IU]/mL — ABNORMAL HIGH (ref 0.450–4.500)

## 2018-02-21 ENCOUNTER — Ambulatory Visit (HOSPITAL_COMMUNITY): Payer: Medicare Other | Attending: Cardiology

## 2018-02-21 ENCOUNTER — Telehealth: Payer: Self-pay | Admitting: Physician Assistant

## 2018-02-21 ENCOUNTER — Other Ambulatory Visit: Payer: Self-pay

## 2018-02-21 DIAGNOSIS — R55 Syncope and collapse: Secondary | ICD-10-CM | POA: Diagnosis not present

## 2018-02-21 DIAGNOSIS — E119 Type 2 diabetes mellitus without complications: Secondary | ICD-10-CM | POA: Insufficient documentation

## 2018-02-21 DIAGNOSIS — I119 Hypertensive heart disease without heart failure: Secondary | ICD-10-CM | POA: Insufficient documentation

## 2018-02-21 DIAGNOSIS — E785 Hyperlipidemia, unspecified: Secondary | ICD-10-CM | POA: Insufficient documentation

## 2018-02-21 DIAGNOSIS — R42 Dizziness and giddiness: Secondary | ICD-10-CM

## 2018-02-21 DIAGNOSIS — R079 Chest pain, unspecified: Secondary | ICD-10-CM | POA: Diagnosis not present

## 2018-02-21 NOTE — Telephone Encounter (Signed)
Called and advised patient of lab results. Advised patient to call PCP ASAP to get in for her thyroid test. Patient verbalized understanding and was calling them now. I advised if they needed anything from us to give us a call.

## 2018-02-21 NOTE — Telephone Encounter (Signed)
New Message: ° ° ° ° ° °Pt is returning a call for lab results. °

## 2018-02-25 ENCOUNTER — Ambulatory Visit (HOSPITAL_COMMUNITY)
Admission: RE | Admit: 2018-02-25 | Discharge: 2018-02-25 | Disposition: A | Discharge status: Home / Self Care | Payer: Medicare Other | Source: Ambulatory Visit | Attending: Cardiovascular Disease | Admitting: Cardiovascular Disease

## 2018-02-25 DIAGNOSIS — R55 Syncope and collapse: Secondary | ICD-10-CM | POA: Insufficient documentation

## 2018-02-25 DIAGNOSIS — R42 Dizziness and giddiness: Secondary | ICD-10-CM

## 2018-02-26 ENCOUNTER — Other Ambulatory Visit: Payer: Self-pay

## 2018-02-26 DIAGNOSIS — R42 Dizziness and giddiness: Secondary | ICD-10-CM

## 2018-02-26 DIAGNOSIS — R55 Syncope and collapse: Secondary | ICD-10-CM

## 2018-02-27 DIAGNOSIS — R946 Abnormal results of thyroid function studies: Secondary | ICD-10-CM | POA: Diagnosis not present

## 2018-03-06 NOTE — Progress Notes (Signed)
Cardiology Office Note:    Date:  03/07/2018   ID:  Sabrina Hooper, DOB [birth year], MRN [id number]  PCP:  Gweneth Dimitri, MD  Cardiologist:  Thurmon Fair, MD   Referring MD: Gweneth Dimitri, MD   Chief Complaint  Patient presents with  . Follow-up    echo and doppler, complaints of dizziness continues to be an issue    History of Present Illness:    Sabrina Hooper is a 84 y.o. female with a hx of hx of HTN, HLD, DM, GERD, and depression. She presents today for a new patient evaluation for an episode of near-syncope and new RBBB.  She was referred by [facility] after a near syncopal episode 12/2017. She nearly passed out after going to the eye doctor and [employer] with her daughter. She had good PO intake that day. She was able to eat 1.5 hrs after episode. She saw her PCP. EKG at that visit with incomplete RBBB and labs significant for hypokalemia of 3.1. A1c was 7.6%.   I last saw her on 02/18/18. She had shingles around 12/31/2017.  These have largely resolved.  However, following this infection she started having periods of dizziness, lightheadedness, and near syncope.  She and her daughter reports very poor p.o. intake.  She only drinks Diet Coke and is not eating at least 3 times a day.  Patient reports having dizziness approximately 4-5 times a week.  She has never completely lost consciousness.  However she describes 2 episodes of near syncope.  Of note, she went away with her son to the beach for 1 week.  Family made sure that she ate 3 times a day and she was hydrated and ate lots of fruit.  She had no dizziness spells during this time.  I suspected her dizziness was due to poor PO intake and dehydration. Of note, I checked her TSH at that visit, which was quite high. I asked her to see her PCP for treatment.   She returns today for follow up. She is here with her daughter. Carotid dopplers and echo did not reveal clear etiology for her dizziness and near-syncope. She has been eating  well, keeping a BP and HR log, and hydrating. She continues to have dizziness. She has fallen once in the the kitchen since her last visit, without hitting head., because of dizziness. We discussed her prior testing and no etiology found for her dizziness. She is working with PCP for her thyroid, but has not yet started thyroid medication. She has not seen ENT. She follows with an eye doctor regularly. She denies chest pain, palpitations, and shortness of breath.    Past Medical History:  Diagnosis Date  . Cerebral atherosclerosis 10/24/2011   Doppler - bilateral bulb/proximal ICAs all demonstrated trace to mild amts fibrous plaque w/ no evidence of significant diameter reduction, dissection or other vascular abnormality  . Chest pain, unspecified 11/29/2007   R/S MV - EF 94%; normal perfusion in all regions, global LV function normal, EKG negative for ischemia;   . Hyperlipidemia   . Hypertension   . Type 2 diabetes mellitus (HCC)     Past Surgical History:  Procedure Laterality Date  . CATARACT EXTRACTION  07/2004, 08/2004  . DILATION AND CURETTAGE OF UTERUS  1985    Current Medications: Current Meds  Medication Sig  . calcium carbonate (OS-CAL) 600 MG TABS Take 600 mg by mouth 2 (two) times daily with a meal.  . citalopram (CELEXA) 20 MG tablet Take  20 mg by mouth daily.  Marland Kitchen. glipiZIDE (GLUCOTROL) 5 MG tablet Take 5 mg by mouth daily.  Marland Kitchen. losartan (COZAAR) 50 MG tablet Take 50 mg by mouth daily.  . metFORMIN (GLUCOPHAGE) 500 MG tablet Take 500 mg by mouth 2 (two) times daily with a meal.  . omeprazole (PRILOSEC) 20 MG capsule Take 20 mg by mouth daily.  . simvastatin (ZOCOR) 20 MG tablet   . [DISCONTINUED] simvastatin (ZOCOR) 10 MG tablet Take 10 mg by mouth at bedtime.     Allergies:   Codeine   Social History   Socioeconomic History  . Marital status: Single    Spouse name: Not on file  . Number of children: Not on file  . Years of education: Not on file  . Highest education  level: Not on file  Occupational History  . Not on file  Social Needs  . Financial resource strain: Not on file  . Food insecurity:    Worry: Not on file    Inability: Not on file  . Transportation needs:    Medical: Not on file    Non-medical: Not on file  Tobacco Use  . Smoking status: Never Smoker  . Smokeless tobacco: Never Used  Substance and Sexual Activity  . Alcohol use: No  . Drug use: No  . Sexual activity: Not on file  Lifestyle  . Physical activity:    Days per week: Not on file    Minutes per session: Not on file  . Stress: Not on file  Relationships  . Social connections:    Talks on phone: Not on file    Gets together: Not on file    Attends religious service: Not on file    Active member of club or organization: Not on file    Attends meetings of clubs or organizations: Not on file    Relationship status: Not on file  Other Topics Concern  . Not on file  Social History Narrative  . Not on file     Family History: The patient's family history includes Heart attack in her brother; Heart disease in her brother; Kidney disease in her brother.  ROS:   Please see the history of present illness.    All other systems reviewed and are negative.  EKGs/Labs/Other Studies Reviewed:    The following studies were reviewed today:  Carotid US 02/26/18 Final Interpretation: Right Carotid: Velocities in the right ICA are consistent with a 1-39% stenosis. The ECA appears >50% stenosed.  Left Carotid: Velocities in the left ICA are consistent with a 1-39% stenosis.  Vertebrals: Bilateral vertebral arteries demonstrate antegrade flow. Subclavians: Normal flow hemodynamics were seen in bilateral subclavian arteries.   Echo 02/21/18  Study Conclusions - Left ventricle: The cavity size was normal. Wall thickness was   normal. Systolic function was normal. The estimated ejection   fraction was in the range of 55% to 60%. Wall motion was normal;   there were no  regional wall motion abnormalities. Doppler   parameters are consistent with abnormal left ventricular   relaxation (grade 1 diastolic dysfunction).  Impressions: - Normal LV systolic function; mild diastolic dysfunction; promient   epicardial fat pad.   EKG:  EKG is not ordered today.     Recent Labs: 02/18/2018: BUN 15; Creatinine, Ser 0.95; Potassium 4.5; Sodium 137; TSH 6.580  Recent Lipid Panel No results found for: CHOL, TRIG, HDL, CHOLHDL, VLDL, LDLCALC, LDLDIRECT  Physical Exam:    VS:  BP 118/64 (BP Location:  Left Arm, Patient Position: Sitting)   Pulse 80   Ht 5\' 5"  (1.651 m)   Wt 154 lb 3.2 oz (69.9 kg)   BMI 25.66 kg/m     Wt Readings from Last 3 Encounters:  03/07/18 154 lb 3.2 oz (69.9 kg)  02/18/18 154 lb (69.9 kg)  03/28/14 160 lb (72.6 kg)     GEN: Well nourished, well developed in no acute distress HEENT: Normal NECK: No JVD; No carotid bruits CARDIAC: RRR, no murmurs, rubs, gallops RESPIRATORY:  Clear to auscultation without rales, wheezing or rhonchi  ABDOMEN: Soft, non-tender, non-distended MUSCULOSKELETAL:  No edema; No deformity  SKIN: Warm and dry NEUROLOGIC:  Alert and oriented x 3 PSYCHIATRIC:  Normal affect   ASSESSMENT:    1. Dizziness   2. Near syncope   3. Essential hypertension    PLAN:    In order of problems listed above:  Dizziness - Plan: LONG TERM MONITOR (3-14 DAYS) Near syncope She continues to have dizziness every day and near syncope - with one fall since her last visit. Will place a zio patch in [place], on her way to [place] to stay with her son. Follow up in 1 months. I have also asked her to follow up with ENT.    Essential hypertension Pressures well-controlled. No medication changes.    Medication Adjustments/Labs and Tests Ordered: Current medicines are reviewed at length with the patient today.  Concerns regarding medicines are outlined above.  Orders Placed This Encounter  Procedures  . LONG TERM  MONITOR (3-14 DAYS)   No orders of the defined types were placed in this encounter.   Signed, Marcelino Duster, [place]  03/07/2018 11:58 AM    [facility]

## 2018-03-07 ENCOUNTER — Ambulatory Visit: Payer: Medicare Other | Admitting: Physician Assistant

## 2018-03-07 ENCOUNTER — Encounter: Payer: Self-pay | Admitting: Physician Assistant

## 2018-03-07 VITALS — BP 118/64 | HR 80 | Ht 65.0 in | Wt 154.2 lb

## 2018-03-07 DIAGNOSIS — R55 Syncope and collapse: Secondary | ICD-10-CM | POA: Diagnosis not present

## 2018-03-07 DIAGNOSIS — I10 Essential (primary) hypertension: Secondary | ICD-10-CM | POA: Diagnosis not present

## 2018-03-07 DIAGNOSIS — R42 Dizziness and giddiness: Secondary | ICD-10-CM

## 2018-03-07 NOTE — Patient Instructions (Addendum)
Medication Instructions:  Continue current medications  If you need a refill on your cardiac medications before your next appointment, please call your pharmacy.  Labwork: None Ordered   Testing/Procedures: Your physician has recommended that you wear an event monitor. Event monitors are medical devices that record the heart's electrical activity. Doctors most often us these monitors to diagnose arrhythmias. Arrhythmias are problems with the speed or rhythm of the heartbeat. The monitor is a small, portable device. You can wear one while you do your normal daily activities. This is usually used to diagnose what is causing palpitations/syncope (passing out).  Follow-Up: You have an appointment for Monitor placement on Monday July 29th @ 1:30 pm in [place] office  Your physician wants you to follow-up in: 1 Month with Micah FlesherAngela Duke.     Thank you for choosing [facility]!!

## 2018-03-11 ENCOUNTER — Ambulatory Visit (INDEPENDENT_AMBULATORY_CARE_PROVIDER_SITE_OTHER): Payer: Medicare Other

## 2018-03-11 DIAGNOSIS — R42 Dizziness and giddiness: Secondary | ICD-10-CM

## 2018-04-04 NOTE — Progress Notes (Signed)
Cardiology Office Note:    Date:  04/08/2018   ID:  Sabrina Hooper, DOB 07/19/1933, MRN [id number]  PCP:  Gweneth Dimitri, MD  Cardiologist:  Thurmon Fair, MD   Referring MD: Gweneth Dimitri, MD   Chief Complaint  Patient presents with  . Follow-up    1 month;    History of Present Illness:    Sabrina Hooper is a 84 y.o. female with a hx of hypertension, hyperlipidemia, DM type II.  I first saw Ms. Posadas on 02/18/2018 as a new patient evaluation for dizziness and falls.  Carotid Dopplers and echocardiogram did not reveal a clear etiology for her dizziness and near syncope.  It was originally thought that her poor p.o. intake was contributing to her dizziness.  Of note, her TSH high for which I asked her to see her PCP.  I also asked her to follow-up with her eye doctor and ENT.  I saw her in follow-up on 03/07/2018 and she was still having dizziness and near syncope.  I ordered a Zio patch to be placed in [place].  She was on her way to stay with her son in [place].  She returns today for follow-up.  Long-term monitor had been placed 02/2018.  Review of heart monitor revealed short runs of atrial tachycardia and SVT. Highest rate in the 150s, did not correlate with symptoms. Preliminarily reviewed with Dr. Royann Shivers. She has not had an dizziness recently and reports that she is doing quite well. We discussed the addition of a beta blocker, but she does not want to add any medications unless she starts having symptoms again. I congratulated her on good PO intake. She has no complaints today.     Past Medical History:  Diagnosis Date  . Cerebral atherosclerosis 10/24/2011   Doppler - bilateral bulb/proximal ICAs all demonstrated trace to mild amts fibrous plaque w/ no evidence of significant diameter reduction, dissection or other vascular abnormality  . Chest pain, unspecified 11/29/2007   R/S MV - EF 94%; normal perfusion in all regions, global LV function normal, EKG negative for  ischemia;   . Hyperlipidemia   . Hypertension   . Type 2 diabetes mellitus (HCC)     Past Surgical History:  Procedure Laterality Date  . CATARACT EXTRACTION  07/2004, 08/2004  . DILATION AND CURETTAGE OF UTERUS  1985    Current Medications: Current Meds  Medication Sig  . calcium carbonate (OS-CAL) 600 MG TABS Take 600 mg by mouth 2 (two) times daily with a meal.  . citalopram (CELEXA) 20 MG tablet Take 20 mg by mouth daily.  Marland Kitchen glipiZIDE (GLUCOTROL) 5 MG tablet Take 5 mg by mouth daily.  Marland Kitchen losartan (COZAAR) 50 MG tablet Take 50 mg by mouth daily.  . metFORMIN (GLUCOPHAGE) 500 MG tablet Take 500 mg by mouth 2 (two) times daily with a meal.  . omeprazole (PRILOSEC) 20 MG capsule Take 20 mg by mouth daily.  . simvastatin (ZOCOR) 20 MG tablet      Allergies:   Codeine   Social History   Socioeconomic History  . Marital status: Single    Spouse name: Not on file  . Number of children: Not on file  . Years of education: Not on file  . Highest education level: Not on file  Occupational History  . Not on file  Social Needs  . Financial resource strain: Not on file  . Food insecurity:    Worry: Not on file  Inability: Not on file  . Transportation needs:    Medical: Not on file    Non-medical: Not on file  Tobacco Use  . Smoking status: Never Smoker  . Smokeless tobacco: Never Used  Substance and Sexual Activity  . Alcohol use: No  . Drug use: No  . Sexual activity: Not on file  Lifestyle  . Physical activity:    Days per week: Not on file    Minutes per session: Not on file  . Stress: Not on file  Relationships  . Social connections:    Talks on phone: Not on file    Gets together: Not on file    Attends religious service: Not on file    Active member of club or organization: Not on file    Attends meetings of clubs or organizations: Not on file    Relationship status: Not on file  Other Topics Concern  . Not on file  Social History Narrative  . Not on  file     Family History: The patient's family history includes Heart attack in her brother; Heart disease in her brother; Kidney disease in her brother.  ROS:   Please see the history of present illness.     All other systems reviewed and are negative.  EKGs/Labs/Other Studies Reviewed:    The following studies were reviewed today:  Zio patch Preliminary read for zio patch: Patient had a min HR of 62 bpm, max HR of 156 bpm, and avg HR of 82  bpm. Predominant underlying rhythm was Sinus Rhythm. Bundle Branch Block/IVCD was present. 8 Supraventricular Tachycardia runs occurred, the run with the fastest interval lasting 6 beats with a max rate of 156 bpm, the longest lasting 15 beats with an avg rate of 106 bpm. Isolated SVEs were rare (<1.0%), SVE Couplets were rare (<1.0%), and SVE Triplets were rare (<1.0%). Isolated VEs were rare (<1.0%), and no VE Couplets or VE Triplets were present.   EKG:  EKG is not ordered today.   Recent Labs: 02/18/2018: BUN 15; Creatinine, Ser 0.95; Potassium 4.5; Sodium 137; TSH 6.580  Recent Lipid Panel No results found for: CHOL, TRIG, HDL, CHOLHDL, VLDL, LDLCALC, LDLDIRECT  Physical Exam:    VS:  BP 134/66   Pulse 90   Ht 5' (1.524 m)   Wt 157 lb (71.2 kg)   BMI 30.66 kg/m     Wt Readings from Last 3 Encounters:  04/08/18 157 lb (71.2 kg)  03/07/18 154 lb 3.2 oz (69.9 kg)  02/18/18 154 lb (69.9 kg)     GEN: Well nourished, well developed in no acute distress HEENT: Normal NECK: No JVD; No carotid bruits CARDIAC: RRR, no murmurs, rubs, gallops RESPIRATORY:  Clear to auscultation without rales, wheezing or rhonchi  ABDOMEN: Soft, non-tender, non-distended MUSCULOSKELETAL:  No edema; No deformity  SKIN: Warm and dry NEUROLOGIC:  Alert and oriented x 3 PSYCHIATRIC:  Normal affect   ASSESSMENT:    1. Dizziness   2. Essential hypertension   3. Hyperlipidemia, unspecified hyperlipidemia type    PLAN:    In order of problems listed  above:  Dizziness Zio patch showed short runs of SVT and atrial tachycardia. It does not appear that her symptoms correlated with SVT, preliminary review with Dr. Royann Shiversroitoru. She has had not further problems with dizziness or near syncope and is feeling well. We discussed the addition of a beta blocker. She would like to hold off for now.   Essential hypertension Well controlled. Continue  losartan.   Hyperlipidemia, unspecified hyperlipidemia type Continue zocor.   I would like her to follow up with Dr. Royann Shivers in 4-6 months; sooner if needed.   Medication Adjustments/Labs and Tests Ordered: Current medicines are reviewed at length with the patient today.  Concerns regarding medicines are outlined above.  No orders of the defined types were placed in this encounter.  No orders of the defined types were placed in this encounter.   Signed, Marcelino Duster, [place]  04/08/2018 10:31 AM    [facility]

## 2018-04-08 ENCOUNTER — Ambulatory Visit: Payer: Medicare Other | Admitting: Physician Assistant

## 2018-04-08 ENCOUNTER — Encounter: Payer: Self-pay | Admitting: Physician Assistant

## 2018-04-08 VITALS — BP 134/66 | HR 90 | Ht 60.0 in | Wt 157.0 lb

## 2018-04-08 DIAGNOSIS — R42 Dizziness and giddiness: Secondary | ICD-10-CM | POA: Diagnosis not present

## 2018-04-08 DIAGNOSIS — E785 Hyperlipidemia, unspecified: Secondary | ICD-10-CM | POA: Diagnosis not present

## 2018-04-08 DIAGNOSIS — I10 Essential (primary) hypertension: Secondary | ICD-10-CM | POA: Diagnosis not present

## 2018-04-08 NOTE — Patient Instructions (Signed)
Medication Instructions: Your physician recommends that you continue on your current medications as directed. Please refer to the Current Medication list given to you today.  If you need a refill on your cardiac medications before your next appointment, please call your pharmacy.   Follow-Up: Your physician wants you to follow-up in December with Dr. Royann Shiversroitoru.   Thank you for choosing [facility] at [facility]!!

## 2018-04-17 ENCOUNTER — Telehealth: Payer: Self-pay | Admitting: Cardiovascular Disease

## 2018-04-17 NOTE — Telephone Encounter (Signed)
Pt aware of monitor results and Dr.C's and Angie Duke,PA recommendations.   Long-term monitor had been placed 02/2018.  Review of heart monitor revealed short runs of atrial tachycardia and SVT. Highest rate in the 150s, did not correlate with symptoms. Preliminarily reviewed with Dr. Royann Shivers. She has not had an dizziness recently and reports that she is doing quite well. We discussed the addition of a beta blocker, but she does not want to add any medications unless she starts having symptoms again. I congratulated her on good PO intake. She has no complaints today.    Adv pt to contact our office if symptoms develops or if she is going to reconsider starting beta blocker therapy as recommended.  Pt verbalized understanding. She will f/u as planned in Dec wit Dr.C

## 2018-04-17 NOTE — Telephone Encounter (Signed)
Patient calling for monitor results. 

## 2018-04-19 DIAGNOSIS — W5501XA Bitten by cat, initial encounter: Secondary | ICD-10-CM | POA: Diagnosis not present

## 2018-04-19 DIAGNOSIS — S81851A Open bite, right lower leg, initial encounter: Secondary | ICD-10-CM | POA: Diagnosis not present

## 2018-05-24 DIAGNOSIS — Z23 Encounter for immunization: Secondary | ICD-10-CM | POA: Diagnosis not present

## 2018-06-18 DIAGNOSIS — E11319 Type 2 diabetes mellitus with unspecified diabetic retinopathy without macular edema: Secondary | ICD-10-CM | POA: Diagnosis not present

## 2018-06-18 DIAGNOSIS — E782 Mixed hyperlipidemia: Secondary | ICD-10-CM | POA: Diagnosis not present

## 2018-06-18 DIAGNOSIS — I10 Essential (primary) hypertension: Secondary | ICD-10-CM | POA: Diagnosis not present

## 2018-06-18 DIAGNOSIS — Z0000 Encounter for general adult medical examination without abnormal findings: Secondary | ICD-10-CM | POA: Diagnosis not present

## 2018-07-18 ENCOUNTER — Ambulatory Visit: Payer: Medicare Other | Admitting: Cardiovascular Disease

## 2018-07-18 DIAGNOSIS — H353133 Nonexudative age-related macular degeneration, bilateral, advanced atrophic without subfoveal involvement: Secondary | ICD-10-CM | POA: Diagnosis not present

## 2018-07-18 DIAGNOSIS — H353212 Exudative age-related macular degeneration, right eye, with inactive choroidal neovascularization: Secondary | ICD-10-CM | POA: Diagnosis not present

## 2018-07-18 DIAGNOSIS — H353222 Exudative age-related macular degeneration, left eye, with inactive choroidal neovascularization: Secondary | ICD-10-CM | POA: Diagnosis not present

## 2018-07-18 DIAGNOSIS — H3561 Retinal hemorrhage, right eye: Secondary | ICD-10-CM | POA: Diagnosis not present

## 2018-09-03 DIAGNOSIS — Z85828 Personal history of other malignant neoplasm of skin: Secondary | ICD-10-CM | POA: Diagnosis not present

## 2018-09-03 DIAGNOSIS — D485 Neoplasm of uncertain behavior of skin: Secondary | ICD-10-CM | POA: Diagnosis not present

## 2018-09-03 DIAGNOSIS — H61001 Unspecified perichondritis of right external ear: Secondary | ICD-10-CM | POA: Diagnosis not present

## 2018-09-03 DIAGNOSIS — C4442 Squamous cell carcinoma of skin of scalp and neck: Secondary | ICD-10-CM | POA: Diagnosis not present

## 2018-09-03 DIAGNOSIS — L570 Actinic keratosis: Secondary | ICD-10-CM | POA: Diagnosis not present

## 2019-01-21 DIAGNOSIS — H353222 Exudative age-related macular degeneration, left eye, with inactive choroidal neovascularization: Secondary | ICD-10-CM | POA: Diagnosis not present

## 2019-01-21 DIAGNOSIS — H353133 Nonexudative age-related macular degeneration, bilateral, advanced atrophic without subfoveal involvement: Secondary | ICD-10-CM | POA: Diagnosis not present

## 2019-01-21 DIAGNOSIS — E119 Type 2 diabetes mellitus without complications: Secondary | ICD-10-CM | POA: Diagnosis not present

## 2019-01-21 DIAGNOSIS — H353212 Exudative age-related macular degeneration, right eye, with inactive choroidal neovascularization: Secondary | ICD-10-CM | POA: Diagnosis not present

## 2019-04-28 ENCOUNTER — Other Ambulatory Visit: Payer: Self-pay | Admitting: Family Medicine

## 2019-04-28 ENCOUNTER — Other Ambulatory Visit (HOSPITAL_COMMUNITY): Payer: Self-pay | Admitting: Family Medicine

## 2019-04-28 ENCOUNTER — Ambulatory Visit (HOSPITAL_COMMUNITY): Payer: Medicare Other

## 2019-04-28 DIAGNOSIS — E782 Mixed hyperlipidemia: Secondary | ICD-10-CM | POA: Diagnosis not present

## 2019-04-28 DIAGNOSIS — G459 Transient cerebral ischemic attack, unspecified: Secondary | ICD-10-CM

## 2019-04-28 DIAGNOSIS — R0989 Other specified symptoms and signs involving the circulatory and respiratory systems: Secondary | ICD-10-CM | POA: Diagnosis not present

## 2019-04-28 DIAGNOSIS — E11319 Type 2 diabetes mellitus with unspecified diabetic retinopathy without macular edema: Secondary | ICD-10-CM | POA: Diagnosis not present

## 2019-04-29 ENCOUNTER — Ambulatory Visit (HOSPITAL_COMMUNITY)
Admission: RE | Admit: 2019-04-29 | Discharge: 2019-04-29 | Disposition: A | Discharge status: Home / Self Care | Payer: Medicare Other | Source: Ambulatory Visit | Attending: Family Medicine | Admitting: Family Medicine

## 2019-04-29 ENCOUNTER — Other Ambulatory Visit: Payer: Self-pay

## 2019-04-29 DIAGNOSIS — G459 Transient cerebral ischemic attack, unspecified: Secondary | ICD-10-CM | POA: Diagnosis not present

## 2019-04-29 DIAGNOSIS — R42 Dizziness and giddiness: Secondary | ICD-10-CM | POA: Diagnosis not present

## 2019-04-29 LAB — POCT I-STAT CREATININE: Creatinine, Ser: 0.7 mg/dL (ref 0.44–1.00)

## 2019-04-29 MED ORDER — GADOBUTROL 1 MMOL/ML IV SOLN
7.0000 mL | Freq: Once | INTRAVENOUS | Status: AC | PRN
  Administered 2019-04-29: 7 mL via INTRAVENOUS

## 2019-05-01 ENCOUNTER — Ambulatory Visit (HOSPITAL_COMMUNITY)
Admission: RE | Admit: 2019-05-01 | Discharge: 2019-05-01 | Disposition: A | Discharge status: Home / Self Care | Payer: Medicare Other | Source: Ambulatory Visit | Attending: Cardiology | Admitting: Cardiology

## 2019-05-01 ENCOUNTER — Other Ambulatory Visit: Payer: Self-pay

## 2019-05-01 DIAGNOSIS — R55 Syncope and collapse: Secondary | ICD-10-CM | POA: Diagnosis not present

## 2019-05-01 DIAGNOSIS — R42 Dizziness and giddiness: Secondary | ICD-10-CM | POA: Insufficient documentation

## 2019-05-14 ENCOUNTER — Other Ambulatory Visit: Payer: Self-pay

## 2019-05-14 ENCOUNTER — Ambulatory Visit: Payer: Medicare Other | Admitting: Neurology

## 2019-05-14 ENCOUNTER — Encounter: Payer: Self-pay | Admitting: Neurology

## 2019-05-14 VITALS — BP 136/78 | HR 94 | Temp 96.4°F | Ht 63.0 in | Wt 151.0 lb

## 2019-05-14 DIAGNOSIS — G459 Transient cerebral ischemic attack, unspecified: Secondary | ICD-10-CM | POA: Diagnosis not present

## 2019-05-14 DIAGNOSIS — R404 Transient alteration of awareness: Secondary | ICD-10-CM

## 2019-05-14 HISTORY — DX: Transient cerebral ischemic attack, unspecified: G45.9

## 2019-05-14 NOTE — Progress Notes (Signed)
Reason for visit: TIA event, transient aphasia  Referring physician: Dr. Larita Fife is a 85 y.o. female  History of present illness:  Sabrina Hooper is an 85 year old right-handed white female with a history of diabetes and hypertension.  The patient was having events of dizziness and syncope in the summer 2019.  She underwent a cardiac work-up at that time.  She had a two-week cardiac monitor that showed episodes of SVT, she had a 2D echocardiogram.  She has not had any recurrence of the syncope, but she did have an episode in February 2020 with transient aphasia.  The patient was talking gibberish for about 20 minutes with resolution of the symptoms.  The patient had a repeat episode that was very similar to the one in February on 9 of September 2020.  The patient had just eaten lunch, she was with her daughter and then had an event of confusion and aphasia.  The patient was talking gibberish.  The entire event lasted 20 or 25 minutes, and then seemed to clear.  The patient was fatigued afterward and took a nap.  She has spotty recollection of the event itself, she does not recall having any vision changes or any focal numbness or weakness of the face, arms, legs.  The daughter did not see any facial droop.  The patient did not recall having a headache or any dizziness around the time of the event.  She denied any palpitations of the heart.  She has no prior history of migraine headache earlier in her life.  A blood sugar was not checked, but the patient had just eaten.  The patient has been placed on low-dose aspirin.  She underwent a carotid Doppler study that was unremarkable, MRI of the brain shows a moderate level small vessel ischemic changes, no acute changes were seen.  The patient is sent to this office for an evaluation.  Over the last 6 months or more, the patient has had some alteration in memory, she lives alone currently but the daughter has taken over the finances and help out  with keeping up with appointments.  The patient still operates a motor vehicle for short distances, she does have vision changes with macular degeneration.  The patient keeps up with her own medications.  Past Medical History:  Diagnosis Date   Acute gastritis    Blood in stool    Carotid bruit    Soft right   Cerebral atherosclerosis 10/24/2011   Doppler - bilateral bulb/proximal ICAs all demonstrated trace to mild amts fibrous plaque w/ no evidence of significant diameter reduction, dissection or other vascular abnormality   Chest pain, unspecified 11/29/2007   R/S MV - EF 94%; normal perfusion in all regions, global LV function normal, EKG negative for ischemia;    Hyperlipidemia    Hypertension    Major depression    Osteopenia    Retinal hemorrhage of right eye    Type 2 diabetes mellitus (HCC)    Wet senile macular retinal degeneration (HCC)    Blind in right eye     Past Surgical History:  Procedure Laterality Date   CATARACT EXTRACTION  07/2004, 08/2004   DILATION AND CURETTAGE OF UTERUS  1985   EYE SURGERY     Right eye due to bleeding    GALLBLADDER SURGERY      Family History  Problem Relation Age of Onset   Kidney disease Brother    Heart disease Brother  Heart attack Brother     Social history:  reports that she has never smoked. She has never used smokeless tobacco. She reports that she does not drink alcohol or use drugs.  Medications:  Prior to Admission medications   Medication Sig Start Date End Date Taking? Authorizing Provider  calcium carbonate (OS-CAL) 600 MG TABS Take 600 mg by mouth 2 (two) times daily with a meal.   Yes [provider]  citalopram (CELEXA) 20 MG tablet Take 20 mg by mouth daily.   Yes [provider]  glipiZIDE (GLUCOTROL) 5 MG tablet Take 5 mg by mouth daily.   Yes [provider]  losartan (COZAAR) 50 MG tablet Take 50 mg by mouth daily.   Yes [provider]  metFORMIN  (GLUCOPHAGE) 500 MG tablet Take 500 mg by mouth 2 (two) times daily with a meal.   Yes [provider]  Multiple Vitamins-Minerals (PRESERVISION/LUTEIN PO) Take by mouth.   Yes [provider]  omeprazole (PRILOSEC) 20 MG capsule Take 20 mg by mouth daily.   Yes [provider]  simvastatin (ZOCOR) 20 MG tablet  03/06/18  Yes [provider]      Allergies  Allergen Reactions   Codeine Nausea And Vomiting and Other (See Comments)    GI upset    ROS:  Out of a complete 14 system review of symptoms, the patient complains only of the following symptoms, and all other reviewed systems are negative.  Transient aphasia Memory disturbance History of syncope  Blood pressure 136/78, pulse 94, temperature (!) 96.4 F (35.8 C), temperature source Temporal, height 5\' 3"  (1.6 m), weight 151 lb (68.5 kg).  Physical Exam  General: The patient is alert and cooperative at the time of the examination.  Eyes: Pupils are equal, round, and reactive to light. Discs are flat bilaterally.  Neck: The neck is supple, no carotid bruits are noted.  Respiratory: The respiratory examination is clear.  Cardiovascular: The cardiovascular examination reveals a regular rate and rhythm, no obvious murmurs or rubs are noted.  Skin: Extremities are with 2+ edema below the knees bilaterally.  Neurologic Exam  Mental status: The patient is alert and oriented x 3 at the time of the examination. The patient has apparent normal recent and remote memory, with an apparently normal attention span and concentration ability.  Mini-Mental status examination done today shows a total score 29/30.   Cranial nerves: Facial symmetry is present. There is good sensation of the face to pinprick and soft touch bilaterally. The strength of the facial muscles and the muscles to head turning and shoulder shrug are normal bilaterally. Speech is well enunciated, no aphasia or dysarthria is noted.  Extraocular movements are full. Visual fields are full. The tongue is midline, and the patient has symmetric elevation of the soft palate. No obvious hearing deficits are noted.  Motor: The motor testing reveals 5 over 5 strength of all 4 extremities. Good symmetric motor tone is noted throughout.  Sensory: Sensory testing is intact to pinprick, soft touch, vibration sensation, and position sense on all 4 extremities. No evidence of extinction is noted.  Coordination: Cerebellar testing reveals good finger-nose-finger and heel-to-shin bilaterally.  Gait and station: Gait is normal. Tandem gait is slightly unsteady. Romberg is negative. No drift is seen.  Reflexes: Deep tendon reflexes are symmetric and normal bilaterally. Toes are downgoing bilaterally.   MRI brain 04/29/19:  IMPRESSION: 1.  No acute intracranial abnormality. 2. Signal changes in the brain most  compatible with chronic small vessel disease are mild to moderate for age. 3. Chronic disproportionate biparietal cerebral volume loss, nonspecific but can be seen with Alzheimer dementia.  * MRI scan images were reviewed online. I agree with the written report.    Assessment/Plan:  1.  TIA event, transient aphasia  2.  Reported memory disturbance  The patient has had 2 episodes that were nearly identical with transient aphasia lasting 20 to 25 minutes with full clearing.  The patient now is on low-dose aspirin.  If another event occurs, the family is to take her to the hospital.  I will set her up for a 30-day cardiac monitor study.  We will get an EEG study to exclude seizures as an etiology.  She will follow-up here in 5 or 6 months, we will follow the memory issues over time.  Sabrina Hooper. Keith Kacey Dysert MD 05/14/2019 11:06 AM  [facility] [address] [place], [place] [id number]  Phone [phone] Fax [phone]

## 2019-05-19 ENCOUNTER — Telehealth: Payer: Self-pay | Admitting: Neurology

## 2019-05-19 NOTE — Telephone Encounter (Signed)
Pt's daughter called wanting to know what the update is on scheduling an appt for the heart monitor. Please advise.

## 2019-05-20 NOTE — Telephone Encounter (Signed)
Noted  

## 2019-05-20 NOTE — Telephone Encounter (Signed)
Pt daughter has called to discuss with Hinton Dyer that she wants pt to stay with her heart Dr. She does not want to go with a [facility] provider.  Daughter is asking if Hinton Dyer can call her around the same time tomorrow as she did today that would be great

## 2019-05-20 NOTE — Telephone Encounter (Signed)
Sent [facility] a message and called and spoke to patient's daughter.

## 2019-05-21 ENCOUNTER — Telehealth: Payer: Self-pay | Admitting: Cardiovascular Disease

## 2019-05-21 NOTE — Telephone Encounter (Signed)
Left message to call back  

## 2019-05-21 NOTE — Telephone Encounter (Signed)
Pts daughter is requesting a call back to discuss below information, she states she was told different when she was in office

## 2019-05-21 NOTE — Telephone Encounter (Signed)
Spoke with daughter - patient was ordered a 30 day event monitor per Dr. Jannifer Franklin. She reports she received a call from [facility] that a 2 week monitor was being mailed out yesterday. Daughter would like this corrected to the order per Dr. Jannifer Franklin.   Will route to Markus Daft to follow up on monitor with daughter

## 2019-05-21 NOTE — Telephone Encounter (Signed)
I left a vm asking a call back from the pt's daughter.

## 2019-05-21 NOTE — Telephone Encounter (Signed)
Pts daughter Butch Penny per [facility]) returned call

## 2019-05-21 NOTE — Telephone Encounter (Signed)
It was Dr. Tobey Grim office Ms. Rosaria Ferries had spoken with. Dr. Jannifer Franklin had mentioned the results from her 2019 14 day ZIO patch monitor in his note.  However, an  order for a 30 day cardiac event monitor was placed 05/14/19 for Dx. Of TIA. [employer] to ship a 30 day cardiac event monitor to the patients home.  Instructions were briefly reviewed as they are included in the monitor kit.

## 2019-05-21 NOTE — Telephone Encounter (Signed)
New message:   Please call patient daughter she has some concerns about her mother appt and monitors. Please call patient daughter back.

## 2019-05-21 NOTE — Telephone Encounter (Signed)
Called patient's daughter and tried to explain process of monitor she really would not let me ex palin . Relayed to her Dr. Loletha Grayer number [phone].

## 2019-05-21 NOTE — Telephone Encounter (Signed)
I reached back out to the pt's daughter ( donna ok per DPR). LVM asking for a call back.

## 2019-05-21 NOTE — Telephone Encounter (Signed)
Lvm 05/21/2019

## 2019-05-22 NOTE — Telephone Encounter (Signed)
I reached out to the pt's daughter Butch Penny ok per dpr). She reports there was some confusion on if the pt should be receiving a 2 week heart monitor or a 30 day monitor.  Butch Penny, reports she received clarification from [facility] (rep who mailed the monitor) the monitor would be for a 30 day as per Dr. Jannifer Franklin order on 05/14/2019. No further questions were asked at the time of the phone call.

## 2019-05-29 ENCOUNTER — Other Ambulatory Visit: Payer: Self-pay

## 2019-05-29 ENCOUNTER — Ambulatory Visit: Payer: Medicare Other | Admitting: Neurology

## 2019-05-29 ENCOUNTER — Telehealth: Payer: Self-pay | Admitting: Neurology

## 2019-05-29 ENCOUNTER — Ambulatory Visit (INDEPENDENT_AMBULATORY_CARE_PROVIDER_SITE_OTHER): Payer: Medicare Other

## 2019-05-29 DIAGNOSIS — I4891 Unspecified atrial fibrillation: Secondary | ICD-10-CM

## 2019-05-29 DIAGNOSIS — G459 Transient cerebral ischemic attack, unspecified: Secondary | ICD-10-CM | POA: Diagnosis not present

## 2019-05-29 DIAGNOSIS — R410 Disorientation, unspecified: Secondary | ICD-10-CM

## 2019-05-29 DIAGNOSIS — R404 Transient alteration of awareness: Secondary | ICD-10-CM

## 2019-05-29 NOTE — Telephone Encounter (Signed)
I called the daughter concerning the brainwave test, EEG study was unremarkable, a 30-day cardiac monitor study has been ordered.

## 2019-05-29 NOTE — Procedures (Signed)
    History:  Sabrina Hooper is an 85 year old patient with a history of recurring episodes of confusion and aphasia.  The patient may feel fatigued afterwards, the entire event may last 20 to 25 minutes.  At least 1 event occurred immediately after eating, the patient does have diabetes.  She is being evaluated for possible seizures.   This is a routine EEG.  No skull defects are noted.  Medications include calcium supplementation, Celexa, glipizide, Cozaar, Glucophage, multivitamins, Prilosec, and Zocor.  EEG classification: Normal awake  Description of the recording: The background rhythms of this recording consists of a fairly well modulated medium amplitude alpha rhythm of 8 Hz that is reactive to eye opening and closure. As the record progresses, the patient appears to remain in the waking state throughout the recording. Photic stimulation was performed, resulting in a bilateral and symmetric photic driving response. Hyperventilation was also performed, resulting in a minimal buildup of the background rhythm activities without significant slowing seen. At no time during the recording does there appear to be evidence of spike or spike wave discharges or evidence of focal slowing. EKG monitor shows no evidence of cardiac rhythm abnormalities with a heart rate of 84.  Impression: This is a normal EEG recording in the waking state. No evidence of ictal or interictal discharges are seen.

## 2019-06-30 ENCOUNTER — Ambulatory Visit: Payer: Medicare Other | Admitting: Diagnostic Neuroimaging

## 2019-07-01 DIAGNOSIS — Z0000 Encounter for general adult medical examination without abnormal findings: Secondary | ICD-10-CM | POA: Diagnosis not present

## 2019-07-01 DIAGNOSIS — Z1389 Encounter for screening for other disorder: Secondary | ICD-10-CM | POA: Diagnosis not present

## 2019-07-01 DIAGNOSIS — I10 Essential (primary) hypertension: Secondary | ICD-10-CM | POA: Diagnosis not present

## 2019-07-01 DIAGNOSIS — E1159 Type 2 diabetes mellitus with other circulatory complications: Secondary | ICD-10-CM | POA: Diagnosis not present

## 2019-07-09 ENCOUNTER — Other Ambulatory Visit: Payer: Self-pay | Admitting: Neurology

## 2019-07-09 ENCOUNTER — Telehealth: Payer: Self-pay | Admitting: Neurology

## 2019-07-09 DIAGNOSIS — G459 Transient cerebral ischemic attack, unspecified: Secondary | ICD-10-CM

## 2019-07-09 DIAGNOSIS — I4891 Unspecified atrial fibrillation: Secondary | ICD-10-CM

## 2019-07-09 NOTE — Telephone Encounter (Signed)
  I called the patient.  The cardiac monitor study was unremarkable.  If the patient continues to have episodes of transient aphasia, I would treat empirically with Keppra.  Cardiac monitor 07/09/19:  Narrative & Impression      The dominant rhythm is normal sinus rhythm with first degree AV block  Normal circadia variation is present.  There is no atrial fibrillation.  There are rare isolated PVCs.   Normal event monitor. No evidence of atrial fibrillation. Rare PVCs are seen.

## 2019-07-22 DIAGNOSIS — H353133 Nonexudative age-related macular degeneration, bilateral, advanced atrophic without subfoveal involvement: Secondary | ICD-10-CM | POA: Diagnosis not present

## 2019-07-22 DIAGNOSIS — H353212 Exudative age-related macular degeneration, right eye, with inactive choroidal neovascularization: Secondary | ICD-10-CM | POA: Diagnosis not present

## 2019-07-22 DIAGNOSIS — E119 Type 2 diabetes mellitus without complications: Secondary | ICD-10-CM | POA: Diagnosis not present

## 2019-07-22 DIAGNOSIS — H353222 Exudative age-related macular degeneration, left eye, with inactive choroidal neovascularization: Secondary | ICD-10-CM | POA: Diagnosis not present

## 2019-10-23 DIAGNOSIS — E1159 Type 2 diabetes mellitus with other circulatory complications: Secondary | ICD-10-CM | POA: Diagnosis not present

## 2019-10-23 DIAGNOSIS — I10 Essential (primary) hypertension: Secondary | ICD-10-CM | POA: Diagnosis not present

## 2019-10-23 DIAGNOSIS — R0989 Other specified symptoms and signs involving the circulatory and respiratory systems: Secondary | ICD-10-CM | POA: Diagnosis not present

## 2019-10-23 DIAGNOSIS — Z0000 Encounter for general adult medical examination without abnormal findings: Secondary | ICD-10-CM | POA: Diagnosis not present

## 2019-10-30 DIAGNOSIS — E782 Mixed hyperlipidemia: Secondary | ICD-10-CM | POA: Diagnosis not present

## 2019-10-30 DIAGNOSIS — F3342 Major depressive disorder, recurrent, in full remission: Secondary | ICD-10-CM | POA: Diagnosis not present

## 2019-10-30 DIAGNOSIS — I10 Essential (primary) hypertension: Secondary | ICD-10-CM | POA: Diagnosis not present

## 2019-10-30 DIAGNOSIS — E1159 Type 2 diabetes mellitus with other circulatory complications: Secondary | ICD-10-CM | POA: Diagnosis not present

## 2019-11-12 ENCOUNTER — Ambulatory Visit: Payer: Medicare Other | Admitting: Neurology

## 2019-11-15 IMAGING — MR MR HEAD WO/W CM
8 of 12 series · 31 of 48 positions shown · IV contrast (gadavist)
Comparison: Head and face CT 03/28/2014.

CLINICAL DATA: 86-year-old female with dizziness and falls since
[REDACTED].

EXAM:
MRI HEAD WITHOUT AND WITH CONTRAST
TECHNIQUE: Multiplanar, multiecho pulse sequences of the brain and surrounding
structures were obtained without and with intravenous contrast.
CONTRAST:  7mL GADAVIST GADOBUTROL 1 MMOL/ML IV SOLN

[Series 3: DWI · axial · 3.0mm · 1.09mm/px · z∈[-45,+114]mm · 8 of 108 slices shown (1 of 4)]
[im 1/108]
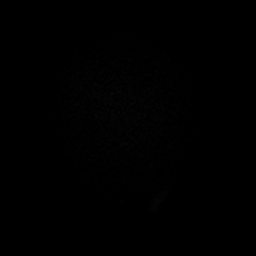
[im 16/108]
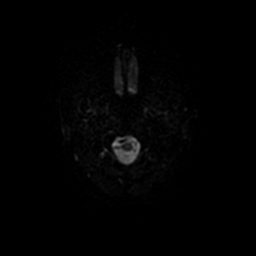
[im 31/108]
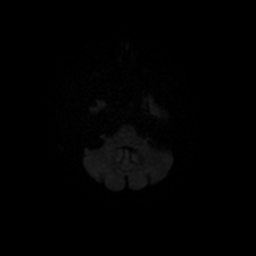
[im 46/108]
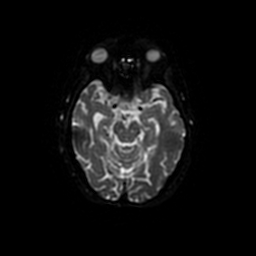
[im 62/108]
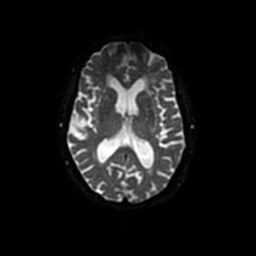
[im 77/108]
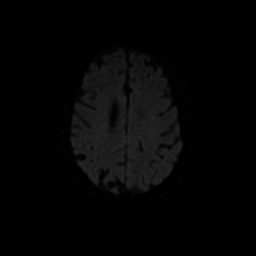
[im 92/108]
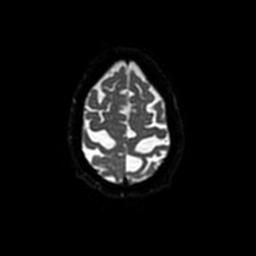
[im 108/108]
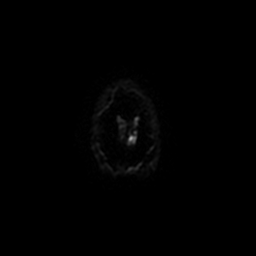

[Series 5: T2 · axial · 5.0mm · 0.43mm/px · 1 of 23 slices shown]
[im 1/23]
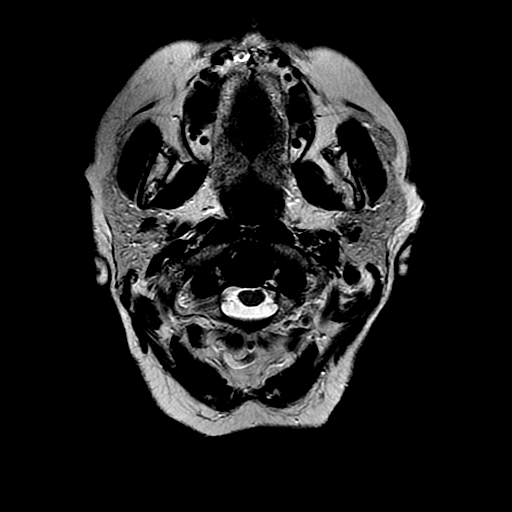

[Series 6: FLAIR · axial · 5.0mm · 0.43mm/px · 1 of 23 slices shown]
[im 1/23]
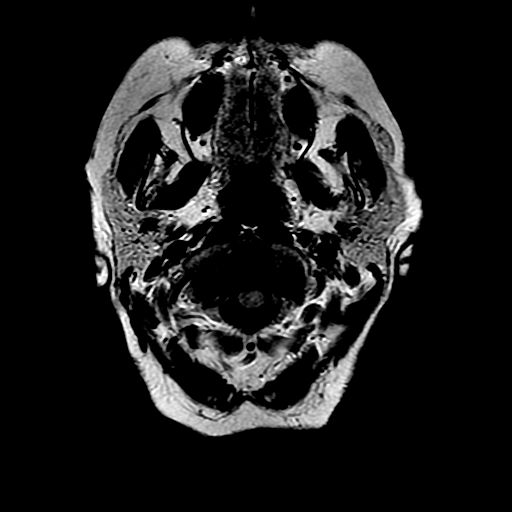

[Series 9: DWI · coronal · 4.0mm · 1.09mm/px · 5 of 86 slices shown (2 of 4)]
[im 1/86]
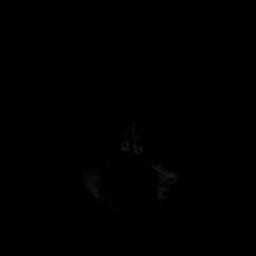
[im 22/86]
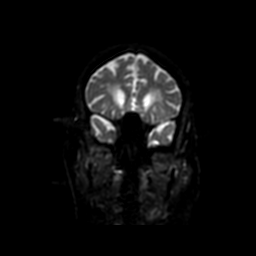
[im 43/86]
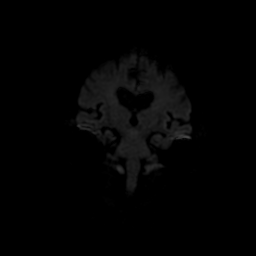
[im 64/86]
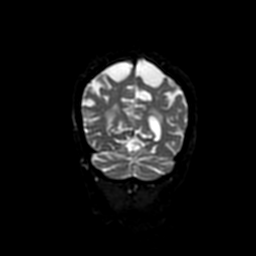
[im 86/86]
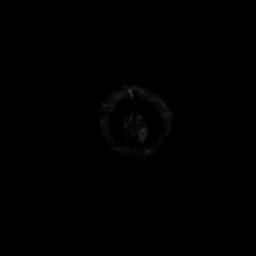

[Series 11: T1 post-contrast · axial · 1.0mm · 0.47mm/px · z∈[-41,+113]mm · 8 of 156 slices shown (1 of 2)]
[im 1/156]
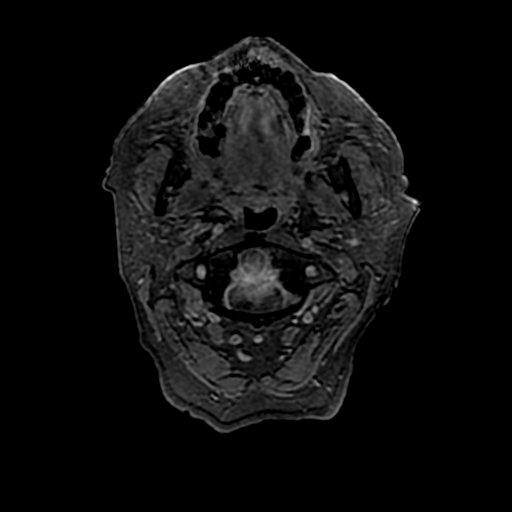
[im 18/156]
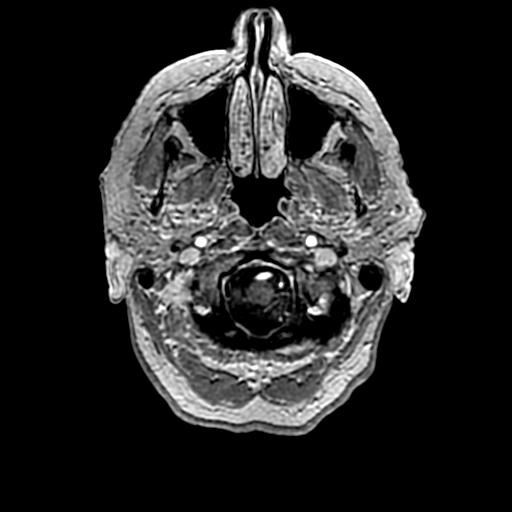
[im 52/156]
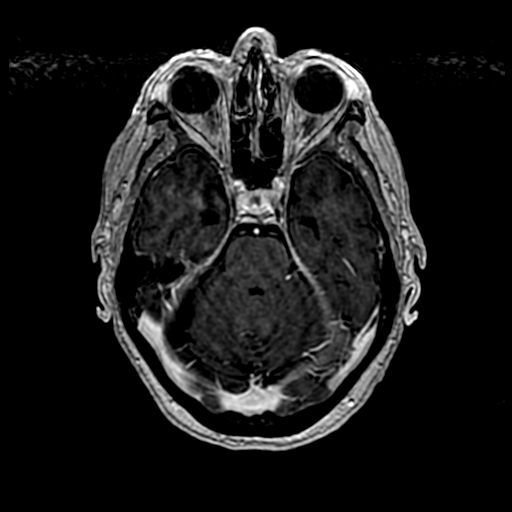
[im 69/156]
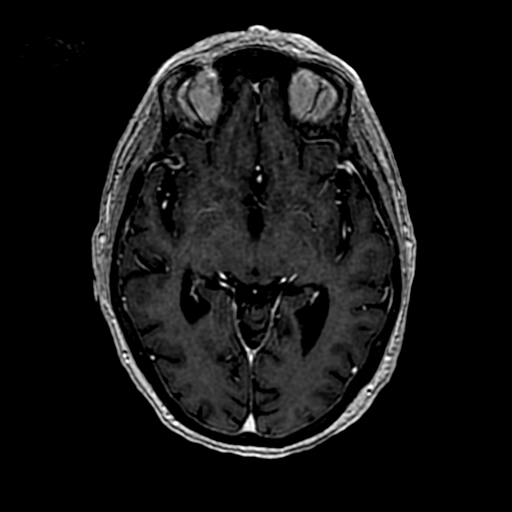
[im 87/156]
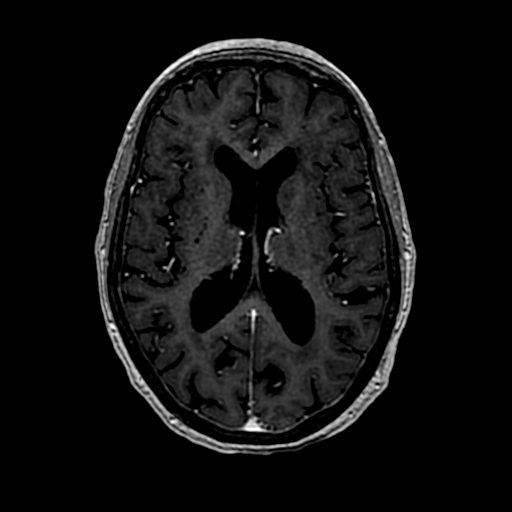
[im 104/156]
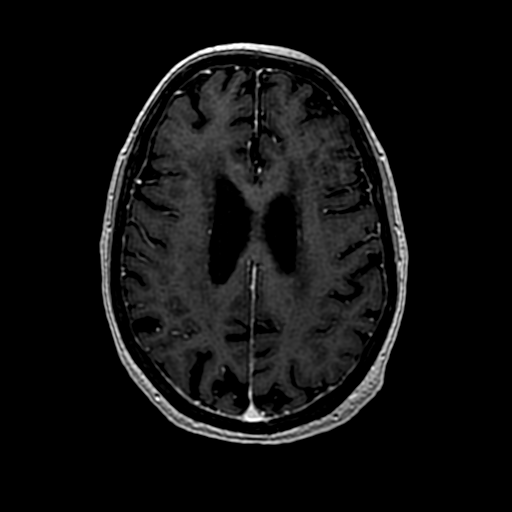
[im 138/156]
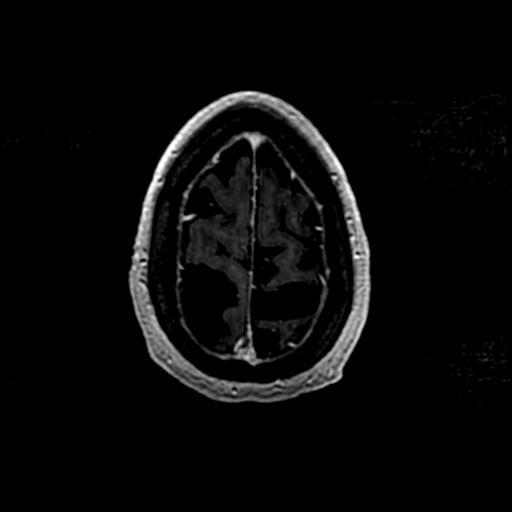
[im 156/156]
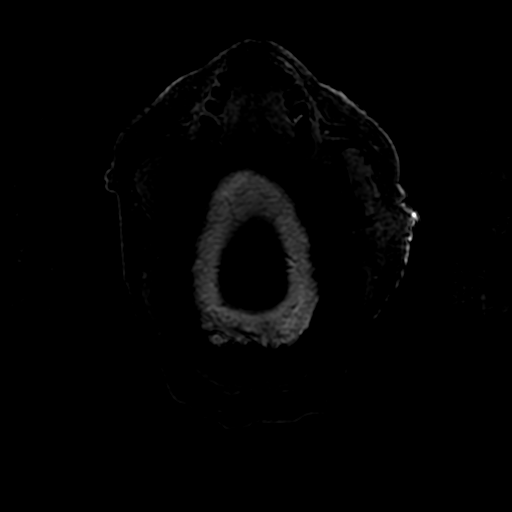

[Series 13: T1 post-contrast · coronal · 5.0mm · 0.43mm/px · 2 of 27 slices shown (2 of 2)]
[im 1/27]
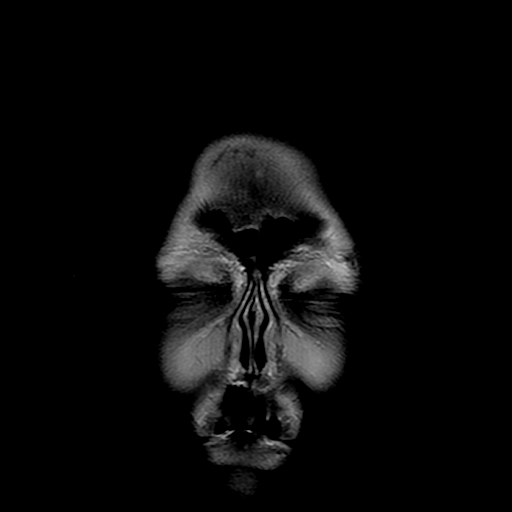
[im 27/27]
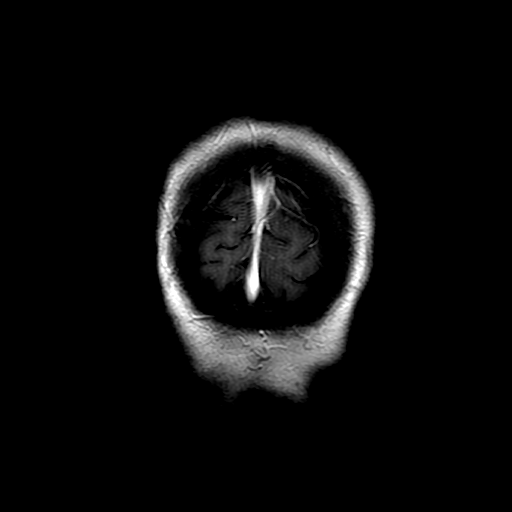

[Series 300: DWI · axial · 3.0mm · 1.09mm/px · z∈[-45,+114]mm · 3 of 54 slices shown (3 of 4)]
[im 1/54]
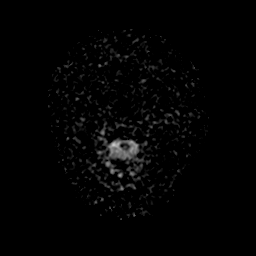
[im 27/54]
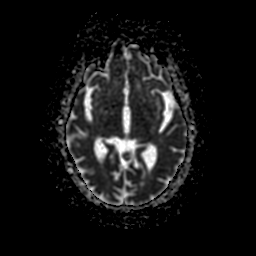
[im 54/54]
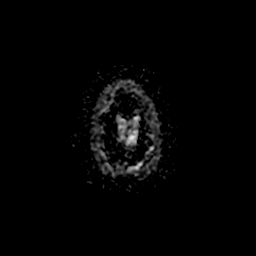

[Series 900: DWI · coronal · 4.0mm · 1.09mm/px · 3 of 43 slices shown (4 of 4)]
[im 1/43]
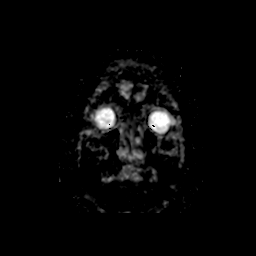
[im 22/43]
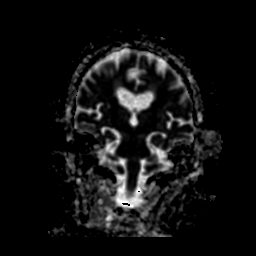
[im 43/43]
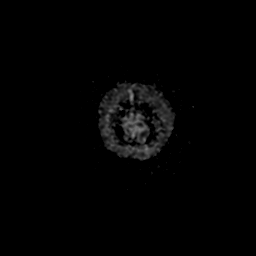

[31 of 48 positions shown; findings below may reference images not displayed]

FINDINGS: Brain: Disproportionate biparietal cerebral volume loss (series 3,
image 47) although not significantly changed since 8929.

No restricted diffusion to suggest acute infarction. No midline
shift, mass effect, evidence of mass lesion, ventriculomegaly,
extra-axial collection or acute intracranial hemorrhage.
Cervicomedullary junction and pituitary are within normal limits.

Patchy and confluent bilateral cerebral white matter T2 and FLAIR
hyperintensity with some deep white matter capsule involvement. No
cortical encephalomalacia or chronic blood products identified. T2
heterogeneity in the bilateral basal ganglia appears mostly due to
perivascular spaces. Mild for age T2 heterogeneity in the pons.
Negative cerebellum.

No abnormal enhancement identified.  No dural thickening.

Vascular: Major intracranial vascular flow voids are preserved. The
major dural venous sinuses are enhancing and appear to be patent.

Skull and upper cervical spine: Degenerative ligamentous hypertrophy
about the odontoid. Cervical disc and endplate degeneration.
Visualized bone marrow signal is within normal limits.

Sinuses/Orbits: Postoperative changes to both globes. Otherwise
negative orbits. Paranasal sinuses are clear.

Other: Mastoids are well pneumatized. Visible internal auditory
structures appear normal. Normal stylomastoid foramina. Scalp and
face soft tissues appear negative, asymmetric left side accessory
parotid tissue anteriorly (normal variant).
IMPRESSION: 1.  No acute intracranial abnormality.
2. Signal changes in the brain most compatible with chronic small
vessel disease are mild to moderate for age.
3. Chronic disproportionate biparietal cerebral volume loss,
nonspecific but can be seen with Alzheimer dementia.

## 2020-01-27 ENCOUNTER — Encounter (INDEPENDENT_AMBULATORY_CARE_PROVIDER_SITE_OTHER): Payer: Self-pay | Admitting: Ophthalmology

## 2020-01-27 ENCOUNTER — Ambulatory Visit (INDEPENDENT_AMBULATORY_CARE_PROVIDER_SITE_OTHER): Payer: Medicare Other | Admitting: Ophthalmology

## 2020-01-27 ENCOUNTER — Other Ambulatory Visit: Payer: Self-pay

## 2020-01-27 DIAGNOSIS — H353222 Exudative age-related macular degeneration, left eye, with inactive choroidal neovascularization: Secondary | ICD-10-CM | POA: Diagnosis not present

## 2020-01-27 DIAGNOSIS — H353114 Nonexudative age-related macular degeneration, right eye, advanced atrophic with subfoveal involvement: Secondary | ICD-10-CM

## 2020-01-27 DIAGNOSIS — H43812 Vitreous degeneration, left eye: Secondary | ICD-10-CM | POA: Diagnosis not present

## 2020-01-27 DIAGNOSIS — E119 Type 2 diabetes mellitus without complications: Secondary | ICD-10-CM

## 2020-01-27 NOTE — Progress Notes (Signed)
01/27/2020     CHIEF COMPLAINT Patient presents for Retina Follow Up   HISTORY OF PRESENT ILLNESS: Sabrina Hooper is a 86 y.o. female who presents to the clinic today for:   HPI    Retina Follow Up    Patient presents with  Dry AMD.  In both eyes.  This started 6 months ago.  Severity is mild.  Duration of 6 months.  Since onset it is stable.          Comments    6 Month AMD F/U OU  Pt denies noticeable changes to [place] OU since last visit. Pt denies ocular pain, flashes of light, or floaters OU.         Last edited by Rockie Neighbours, Gordon on 01/27/2020  9:32 AM. (History)      Referring physician: Cari Caraway, MD [place],  [place] [id number]  HISTORICAL INFORMATION:   Selected notes from the [facility]: No current outpatient medications on file. (Ophthalmic Drugs)   No current facility-administered medications for this visit. (Ophthalmic Drugs)   Current Outpatient Medications (Other)  Medication Sig  . calcium carbonate (OS-CAL) 600 MG TABS Take 600 mg by mouth 2 (two) times daily with a meal.  . citalopram (CELEXA) 20 MG tablet Take 20 mg by mouth daily.  Marland Kitchen glipiZIDE (GLUCOTROL) 5 MG tablet Take 5 mg by mouth daily.  Marland Kitchen losartan (COZAAR) 50 MG tablet Take 50 mg by mouth daily.  . metFORMIN (GLUCOPHAGE) 500 MG tablet Take 500 mg by mouth 2 (two) times daily with a meal.  . Multiple Vitamins-Minerals (PRESERVISION/LUTEIN PO) Take by mouth.  Marland Kitchen omeprazole (PRILOSEC) 20 MG capsule Take 20 mg by mouth daily.  . simvastatin (ZOCOR) 20 MG tablet    No current facility-administered medications for this visit. (Other)      REVIEW OF SYSTEMS:    ALLERGIES Allergies  Allergen Reactions  . Codeine Nausea And Vomiting and Other (See Comments)    GI upset    PAST MEDICAL HISTORY Past Medical History:  Diagnosis Date  . Acute gastritis   . Blood in stool   . Carotid bruit    Soft right  . Cerebral  atherosclerosis 10/24/2011   Doppler - bilateral bulb/proximal ICAs all demonstrated trace to mild amts fibrous plaque w/ no evidence of significant diameter reduction, dissection or other vascular abnormality  . Chest pain, unspecified 11/29/2007   R/S MV - EF 94%; normal perfusion in all regions, global LV function normal, EKG negative for ischemia;   . Hyperlipidemia   . Hypertension   . Major depression   . Osteopenia   . Retinal hemorrhage of right eye   . TIA (transient ischemic attack) 05/14/2019   Transient aphasia  . Type 2 diabetes mellitus ([place])   . Wet senile macular retinal degeneration ([place])    Blind in right eye    Past Surgical History:  Procedure Laterality Date  . CATARACT EXTRACTION  07/2004, 08/2004  . DILATION AND CURETTAGE OF UTERUS  1985  . EYE SURGERY     Right eye due to bleeding   . GALLBLADDER SURGERY      FAMILY HISTORY Family History  Problem Relation Age of Onset  . Kidney disease Brother   . Heart disease Brother   . Heart attack Brother     SOCIAL HISTORY Social History   Tobacco Use  . Smoking status: Never Smoker  .  Smokeless tobacco: Never Used  Substance Use Topics  . Alcohol use: No  . Drug use: No         OPHTHALMIC EXAM:  Base Eye Exam    Visual Acuity (ETDRS)      Right Left   Dist [place] 20/70head tilt 20/50 +2   Dist ph [place] NI 20/25 -1       Tonometry (Tonopen, 9:37 AM)      Right Left   Pressure 06 05       Pupils      Pupils Dark Light Shape React APD   Right PERRL 4 3 Round Brisk None   Left PERRL 4 3 Round Brisk None       Visual Fields (Counting fingers)      Left Right    Full Full       Extraocular Movement      Right Left    Full Full       Neuro/Psych    Oriented x3: Yes   Mood/Affect: Normal       Dilation    Both eyes: 1.0% Mydriacyl, 2.5% Phenylephrine @ 9:37 AM        Slit Lamp and Fundus Exam    Slit Lamp Exam      Right Left   Lids/Lashes Normal Normal   Conjunctiva/Sclera White  and quiet White and quiet   Cornea Clear Clear   Anterior Chamber Deep and quiet Deep and quiet   Iris Round and reactive Round and reactive   Lens Centered posterior chamber intraocular lens, Open posterior capsule Centered posterior chamber intraocular lens   Anterior Vitreous Normal Normal       Fundus Exam      Right Left   Posterior Vitreous Posterior vitreous detachment Posterior vitreous detachment   Disc Peripapillary atrophy Peripapillary atrophy   C/D Ratio 0.35 0.45   Macula Geographic atrophy in the foveal avascular zone, extensor acuity Geographic atrophy, perifoveal not in the FAZ   Vessels Normal    Periphery Normal           IMAGING AND PROCEDURES  Imaging and Procedures for 01/27/20  OCT, Retina - OU - Both Eyes       Right Eye Quality was good. Scan locations included subfoveal. Central Foveal Thickness: 333. Progression has worsened. Findings include abnormal foveal contour, retinal drusen , outer retinal atrophy, subretinal hyper-reflective material, subretinal scarring.   Left Eye Quality was good. Central Foveal Thickness: 275. Progression has been stable. Findings include abnormal foveal contour, pigment epithelial detachment, no IRF, no SRF.                 ASSESSMENT/PLAN:  Advanced nonexudative age-related macular degeneration of right eye with subfoveal involvement Dry ARMD accounts for the acuity.  No specific therapy warranted.  Subfoveal scarring from inactive CNVM.  Diabetes mellitus without complication (HCC) The patient has diabetes without any evidence of retinopathy. The patient advised to maintain good blood glucose control, excellent blood pressure control, and favorable levels of cholesterol, low density lipoprotein, and high density lipoproteins. Follow up in 1 year was recommended. Explained that fluctuations in visual acuity , or "out of focus", may result from large variations of blood sugar control.      ICD-10-CM   1.  Exudative age-related macular degeneration of left eye with inactive choroidal neovascularization (HCC)  H35.3222 OCT, Retina - OU - Both Eyes  2. Diabetes mellitus without complication (HCC)  E11.9   3. Posterior vitreous detachment  of left eye  H43.812   4. Advanced nonexudative age-related macular degeneration of right eye with subfoveal involvement  H35.3114     1.  2.  3.  Ophthalmic Meds Ordered this visit:  No orders of the defined types were placed in this encounter.      Return in about 6 months (around 07/28/2020) for DILATE OU, OCT.  There are no Patient Instructions on file for this visit.   Explained the diagnoses, plan, and follow up with the patient and they expressed understanding.  Patient expressed understanding of the importance of proper follow up care.   Alford Highland Kaymon Denomme M.D. Diseases & Surgery of the Retina and Vitreous [facility] 01/27/20     Abbreviations: M myopia (nearsighted); A astigmatism; H hyperopia (farsighted); P presbyopia; Mrx spectacle prescription;  CTL contact lenses; OD right eye; OS left eye; OU both eyes  XT exotropia; ET esotropia; PEK punctate epithelial keratitis; PEE punctate epithelial erosions; DES dry eye syndrome; MGD meibomian gland dysfunction; ATs artificial tears; PFAT's preservative free artificial tears; NSC nuclear sclerotic cataract; PSC posterior subcapsular cataract; ERM epi-retinal membrane; PVD posterior vitreous detachment; RD retinal detachment; DM diabetes mellitus; DR diabetic retinopathy; NPDR non-proliferative diabetic retinopathy; PDR proliferative diabetic retinopathy; CSME clinically significant macular edema; DME diabetic macular edema; dbh dot blot hemorrhages; CWS cotton wool spot; POAG primary open angle glaucoma; C/D cup-to-disc ratio; HVF humphrey visual field; GVF goldmann visual field; OCT optical coherence tomography; IOP intraocular pressure; BRVO Branch retinal vein occlusion; CRVO  central retinal vein occlusion; CRAO central retinal artery occlusion; BRAO branch retinal artery occlusion; RT retinal tear; SB scleral buckle; PPV pars plana vitrectomy; VH Vitreous hemorrhage; PRP panretinal laser photocoagulation; IVK intravitreal kenalog; VMT vitreomacular traction; MH Macular hole;  NVD neovascularization of the disc; NVE neovascularization elsewhere; AREDS age related eye disease study; ARMD age related macular degeneration; POAG primary open angle glaucoma; EBMD epithelial/anterior basement membrane dystrophy; ACIOL anterior chamber intraocular lens; IOL intraocular lens; PCIOL posterior chamber intraocular lens; Phaco/IOL phacoemulsification with intraocular lens placement; PRK photorefractive keratectomy; LASIK laser assisted in situ keratomileusis; HTN hypertension; DM diabetes mellitus; COPD chronic obstructive pulmonary disease

## 2020-01-27 NOTE — Assessment & Plan Note (Signed)

## 2020-01-27 NOTE — Assessment & Plan Note (Signed)
Dry ARMD accounts for the acuity.  No specific therapy warranted.  Subfoveal scarring from inactive CNVM.

## 2020-02-03 DIAGNOSIS — I10 Essential (primary) hypertension: Secondary | ICD-10-CM | POA: Diagnosis not present

## 2020-02-03 DIAGNOSIS — E1165 Type 2 diabetes mellitus with hyperglycemia: Secondary | ICD-10-CM | POA: Diagnosis not present

## 2020-02-03 DIAGNOSIS — E1159 Type 2 diabetes mellitus with other circulatory complications: Secondary | ICD-10-CM | POA: Diagnosis not present

## 2020-02-03 DIAGNOSIS — F3342 Major depressive disorder, recurrent, in full remission: Secondary | ICD-10-CM | POA: Diagnosis not present

## 2020-02-19 DIAGNOSIS — D485 Neoplasm of uncertain behavior of skin: Secondary | ICD-10-CM | POA: Diagnosis not present

## 2020-02-19 DIAGNOSIS — Z85828 Personal history of other malignant neoplasm of skin: Secondary | ICD-10-CM | POA: Diagnosis not present

## 2020-02-19 DIAGNOSIS — C44311 Basal cell carcinoma of skin of nose: Secondary | ICD-10-CM | POA: Diagnosis not present

## 2020-03-03 DIAGNOSIS — E1165 Type 2 diabetes mellitus with hyperglycemia: Secondary | ICD-10-CM | POA: Diagnosis not present

## 2020-03-03 DIAGNOSIS — I10 Essential (primary) hypertension: Secondary | ICD-10-CM | POA: Diagnosis not present

## 2020-03-03 DIAGNOSIS — E1159 Type 2 diabetes mellitus with other circulatory complications: Secondary | ICD-10-CM | POA: Diagnosis not present

## 2020-03-03 DIAGNOSIS — F3342 Major depressive disorder, recurrent, in full remission: Secondary | ICD-10-CM | POA: Diagnosis not present

## 2020-03-11 DIAGNOSIS — C44311 Basal cell carcinoma of skin of nose: Secondary | ICD-10-CM | POA: Diagnosis not present

## 2020-03-11 DIAGNOSIS — Z85828 Personal history of other malignant neoplasm of skin: Secondary | ICD-10-CM | POA: Diagnosis not present

## 2020-04-26 DIAGNOSIS — E1165 Type 2 diabetes mellitus with hyperglycemia: Secondary | ICD-10-CM | POA: Diagnosis not present

## 2020-04-26 DIAGNOSIS — N1831 Chronic kidney disease, stage 3a: Secondary | ICD-10-CM | POA: Diagnosis not present

## 2020-04-26 DIAGNOSIS — E1159 Type 2 diabetes mellitus with other circulatory complications: Secondary | ICD-10-CM | POA: Diagnosis not present

## 2020-04-26 DIAGNOSIS — I10 Essential (primary) hypertension: Secondary | ICD-10-CM | POA: Diagnosis not present

## 2020-05-06 DIAGNOSIS — I10 Essential (primary) hypertension: Secondary | ICD-10-CM | POA: Diagnosis not present

## 2020-05-06 DIAGNOSIS — J309 Allergic rhinitis, unspecified: Secondary | ICD-10-CM | POA: Diagnosis not present

## 2020-05-06 DIAGNOSIS — E1165 Type 2 diabetes mellitus with hyperglycemia: Secondary | ICD-10-CM | POA: Diagnosis not present

## 2020-05-06 DIAGNOSIS — E782 Mixed hyperlipidemia: Secondary | ICD-10-CM | POA: Diagnosis not present

## 2020-05-10 DIAGNOSIS — E1165 Type 2 diabetes mellitus with hyperglycemia: Secondary | ICD-10-CM | POA: Diagnosis not present

## 2020-05-10 DIAGNOSIS — I10 Essential (primary) hypertension: Secondary | ICD-10-CM | POA: Diagnosis not present

## 2020-05-10 DIAGNOSIS — E782 Mixed hyperlipidemia: Secondary | ICD-10-CM | POA: Diagnosis not present

## 2020-05-10 DIAGNOSIS — E1159 Type 2 diabetes mellitus with other circulatory complications: Secondary | ICD-10-CM | POA: Diagnosis not present

## 2020-07-06 DIAGNOSIS — E1165 Type 2 diabetes mellitus with hyperglycemia: Secondary | ICD-10-CM | POA: Diagnosis not present

## 2020-07-06 DIAGNOSIS — I10 Essential (primary) hypertension: Secondary | ICD-10-CM | POA: Diagnosis not present

## 2020-07-06 DIAGNOSIS — N1831 Chronic kidney disease, stage 3a: Secondary | ICD-10-CM | POA: Diagnosis not present

## 2020-07-06 DIAGNOSIS — E1159 Type 2 diabetes mellitus with other circulatory complications: Secondary | ICD-10-CM | POA: Diagnosis not present

## 2020-07-13 DIAGNOSIS — Z23 Encounter for immunization: Secondary | ICD-10-CM | POA: Diagnosis not present

## 2020-07-13 DIAGNOSIS — Z0000 Encounter for general adult medical examination without abnormal findings: Secondary | ICD-10-CM | POA: Diagnosis not present

## 2020-07-13 DIAGNOSIS — Z1389 Encounter for screening for other disorder: Secondary | ICD-10-CM | POA: Diagnosis not present

## 2020-07-29 ENCOUNTER — Encounter (INDEPENDENT_AMBULATORY_CARE_PROVIDER_SITE_OTHER): Payer: Medicare Other | Admitting: Ophthalmology

## 2020-08-01 ENCOUNTER — Other Ambulatory Visit: Payer: Self-pay

## 2020-08-01 ENCOUNTER — Ambulatory Visit
Admission: EM | Admit: 2020-08-01 | Discharge: 2020-08-01 | Disposition: A | Discharge status: Home / Self Care | Payer: Medicare Other | Attending: Family Medicine | Admitting: Family Medicine

## 2020-08-01 DIAGNOSIS — R32 Unspecified urinary incontinence: Secondary | ICD-10-CM | POA: Diagnosis not present

## 2020-08-01 NOTE — ED Provider Notes (Signed)
[facility]    CSN: [id number] Arrival date & time: 08/01/20  1343      History   Chief Complaint Chief Complaint  Patient presents with  . Altered Mental Status    X 2 day    HPI Sabrina Hooper is a 86 y.o. female.   She is presenting with urinary complaints per history by her daughter.  Her daughter reports she has been having altered mental status.  She reported back pain intermittently earlier today.  She is not acting like her normal self.  Denies any fevers or chills.  HPI  Past Medical History:  Diagnosis Date  . Acute gastritis   . Blood in stool   . Carotid bruit    Soft right  . Cerebral atherosclerosis 10/24/2011   Doppler - bilateral bulb/proximal ICAs all demonstrated trace to mild amts fibrous plaque w/ no evidence of significant diameter reduction, dissection or other vascular abnormality  . Chest pain, unspecified 11/29/2007   R/S MV - EF 94%; normal perfusion in all regions, global LV function normal, EKG negative for ischemia;   . Hyperlipidemia   . Hypertension   . Major depression   . Osteopenia   . Retinal hemorrhage of right eye   . TIA (transient ischemic attack) 05/14/2019   Transient aphasia  . Type 2 diabetes mellitus (HCC)   . Wet senile macular retinal degeneration (HCC)    Blind in right eye     Patient Active Problem List   Diagnosis Date Noted  . Exudative age-related macular degeneration of left eye with inactive choroidal neovascularization (HCC) 01/27/2020  . Diabetes mellitus without complication (HCC) 01/27/2020  . Posterior vitreous detachment of left eye 01/27/2020  . Advanced nonexudative age-related macular degeneration of right eye with subfoveal involvement 01/27/2020  . TIA (transient ischemic attack) 05/14/2019  . Dizziness 04/08/2018  . Essential hypertension 02/13/2013  . Hyperlipidemia 02/13/2013  . Type 2 diabetes mellitus (HCC) 02/13/2013    Past Surgical History:  Procedure Laterality Date  .  CATARACT EXTRACTION  07/2004, 08/2004  . DILATION AND CURETTAGE OF UTERUS  1985  . EYE SURGERY     Right eye due to bleeding   . GALLBLADDER SURGERY      OB History   No obstetric history on file.      Home Medications    Prior to Admission medications   Medication Sig Start Date End Date Taking? Authorizing Provider  calcium carbonate (OS-CAL) 600 MG TABS Take 600 mg by mouth 2 (two) times daily with a meal.    [provider]  citalopram (CELEXA) 20 MG tablet Take 20 mg by mouth daily.    [provider]  glipiZIDE (GLUCOTROL) 5 MG tablet Take 5 mg by mouth daily.    [provider]  losartan (COZAAR) 50 MG tablet Take 50 mg by mouth daily.    [provider]  metFORMIN (GLUCOPHAGE) 500 MG tablet Take 500 mg by mouth 2 (two) times daily with a meal.    [provider]  Multiple Vitamins-Minerals (PRESERVISION/LUTEIN PO) Take by mouth.    [provider]  omeprazole (PRILOSEC) 20 MG capsule Take 20 mg by mouth daily.    [provider]  simvastatin (ZOCOR) 20 MG tablet  03/06/18   [provider]    Family History Family History  Problem Relation Age of Onset  . Kidney disease Brother   . Heart disease Brother   . Heart attack Brother  Social History Social History   Tobacco Use  . Smoking status: Never Smoker  . Smokeless tobacco: Never Used  Vaping Use  . Vaping Use: Never used  Substance Use Topics  . Alcohol use: No  . Drug use: No     Allergies   Codeine   Review of Systems Review of Systems  See HPI  Physical Exam Triage Vital Signs ED Triage Vitals  Enc Vitals Group     BP 08/01/20 1432 114/70     Pulse Rate 08/01/20 1432 (!) 101     Resp 08/01/20 1432 17     Temp 08/01/20 1432 98.2 F (36.8 C)     Temp Source 08/01/20 1432 Oral     SpO2 08/01/20 1432 99 %     Weight --      Height --      Head Circumference --      Peak Flow --      Pain Score 08/01/20 1434 0      Pain Loc --      Pain Edu? --      Excl. in GC? --    No data found.  Updated Vital Signs BP 114/70 (BP Location: Left Arm)   Pulse (!) 101   Temp 98.2 F (36.8 C) (Oral)   Resp 17   SpO2 99%   Visual Acuity Right Eye Distance:   Left Eye Distance:   Bilateral Distance:    Right Eye Near:   Left Eye Near:    Bilateral Near:     Physical Exam Gen: NAD, alert, cooperative with exam,  ENT: normal lips, normal nasal mucosa,  Eye: normal EOM, normal conjunctiva and lids Resp: no accessory muscle use, non-labored,  Skin: no rashes, no areas of induration  Neuro: normal tone, normal sensation to touch Psych:  normal insight, alert and oriented   UC Treatments / Results  Labs (all labs ordered are listed, but only abnormal results are displayed) Labs Reviewed  COMPREHENSIVE METABOLIC PANEL  CBC WITH DIFFERENTIAL/PLATELET  POCT URINALYSIS DIP (MANUAL ENTRY)    EKG   Radiology No results found.  Procedures Procedures (including critical care time)  Medications Ordered in UC Medications - No data to display  Initial Impression / Assessment and Plan / UC Course  I have reviewed the triage vital signs and the nursing notes.  Pertinent labs & imaging results that were available during my care of the patient were reviewed by me and considered in my medical decision making (see chart for details).     Sabrina Hooper is an 86 year old female that is presenting with concern for urinary infection as a source to her altered status.  She had trouble with down but could be directed to the correct answer.  Otherwise she was in normal alertness and awareness.  Unable to obtain a urine sample.  Patient left prior to the lab draw.  Provided future labs for urinalysis and urine culture.  Counseled on need for follow-up and seek more immediate care.  Final Clinical Impressions(s) / UC Diagnoses   Final diagnoses:  Urinary incontinence, unspecified type   Discharge Instructions    None    ED Prescriptions    None     PDMP not reviewed this encounter.   Myra Rude, MD 08/01/20 [phone]

## 2020-08-01 NOTE — ED Triage Notes (Signed)
Daughter states patient lost control of bladder while in bed and was confused. Pt was evaluated by ems and told to go to the urgent care due to ED wait times.

## 2020-08-02 DIAGNOSIS — R350 Frequency of micturition: Secondary | ICD-10-CM | POA: Diagnosis not present

## 2020-08-03 DIAGNOSIS — R350 Frequency of micturition: Secondary | ICD-10-CM | POA: Diagnosis not present

## 2020-08-03 DIAGNOSIS — I10 Essential (primary) hypertension: Secondary | ICD-10-CM | POA: Diagnosis not present

## 2020-08-03 DIAGNOSIS — E1159 Type 2 diabetes mellitus with other circulatory complications: Secondary | ICD-10-CM | POA: Diagnosis not present

## 2020-08-03 DIAGNOSIS — E782 Mixed hyperlipidemia: Secondary | ICD-10-CM | POA: Diagnosis not present

## 2020-08-04 ENCOUNTER — Encounter (INDEPENDENT_AMBULATORY_CARE_PROVIDER_SITE_OTHER): Payer: Medicare Other | Admitting: Ophthalmology

## 2020-08-09 DIAGNOSIS — E782 Mixed hyperlipidemia: Secondary | ICD-10-CM | POA: Diagnosis not present

## 2020-08-09 DIAGNOSIS — E1159 Type 2 diabetes mellitus with other circulatory complications: Secondary | ICD-10-CM | POA: Diagnosis not present

## 2020-08-09 DIAGNOSIS — R350 Frequency of micturition: Secondary | ICD-10-CM | POA: Diagnosis not present

## 2020-08-09 DIAGNOSIS — E538 Deficiency of other specified B group vitamins: Secondary | ICD-10-CM | POA: Diagnosis not present

## 2020-08-17 ENCOUNTER — Ambulatory Visit (INDEPENDENT_AMBULATORY_CARE_PROVIDER_SITE_OTHER): Payer: Medicare Other | Admitting: Ophthalmology

## 2020-08-17 ENCOUNTER — Encounter (INDEPENDENT_AMBULATORY_CARE_PROVIDER_SITE_OTHER): Payer: Self-pay | Admitting: Ophthalmology

## 2020-08-17 ENCOUNTER — Other Ambulatory Visit: Payer: Self-pay

## 2020-08-17 DIAGNOSIS — H353114 Nonexudative age-related macular degeneration, right eye, advanced atrophic with subfoveal involvement: Secondary | ICD-10-CM

## 2020-08-17 DIAGNOSIS — H353222 Exudative age-related macular degeneration, left eye, with inactive choroidal neovascularization: Secondary | ICD-10-CM

## 2020-08-17 NOTE — Assessment & Plan Note (Signed)
Subfoveal scarring, no signs of active CNVM

## 2020-08-17 NOTE — Progress Notes (Signed)
08/17/2020     CHIEF COMPLAINT Patient presents for Retina Follow Up (7 Month F/U OU//Pt denies noticeable changes to [place] OU since last visit. Pt denies ocular pain, flashes of light, or floaters OU. Pt denies any new symptoms OU.)   HISTORY OF PRESENT ILLNESS: Sabrina Hooper is a 87 y.o. female who presents to the clinic today for:   HPI    Retina Follow Up    Patient presents with  Wet AMD.  In both eyes.  This started 7 months ago.  Severity is moderate.  Duration of 7 months.  Since onset it is stable. Additional comments: 7 Month F/U OU  Pt denies noticeable changes to [place] OU since last visit. Pt denies ocular pain, flashes of light, or floaters OU. Pt denies any new symptoms OU.       Last edited by Ileana Roup, COA on 08/17/2020  9:48 AM. (History)      Referring physician: Gweneth Dimitri, MD [address] [place],  [place] [address]  HISTORICAL INFORMATION:   Selected notes from the MEDICAL RECORD NUMBER       CURRENT MEDICATIONS: No current outpatient medications on file. (Ophthalmic Drugs)   No current facility-administered medications for this visit. (Ophthalmic Drugs)   Current Outpatient Medications (Other)  Medication Sig  . calcium carbonate (OS-CAL) 600 MG TABS Take 600 mg by mouth 2 (two) times daily with a meal.  . citalopram (CELEXA) 20 MG tablet Take 20 mg by mouth daily.  Marland Kitchen glipiZIDE (GLUCOTROL) 5 MG tablet Take 5 mg by mouth daily.  Marland Kitchen losartan (COZAAR) 50 MG tablet Take 50 mg by mouth daily.  . metFORMIN (GLUCOPHAGE) 500 MG tablet Take 500 mg by mouth 2 (two) times daily with a meal.  . Multiple Vitamins-Minerals (PRESERVISION/LUTEIN PO) Take by mouth.  Marland Kitchen omeprazole (PRILOSEC) 20 MG capsule Take 20 mg by mouth daily.  . simvastatin (ZOCOR) 20 MG tablet    No current facility-administered medications for this visit. (Other)      REVIEW OF SYSTEMS:    ALLERGIES Allergies  Allergen Reactions  . Codeine Nausea And Vomiting and Other (See  Comments)    GI upset    PAST MEDICAL HISTORY Past Medical History:  Diagnosis Date  . Acute gastritis   . Blood in stool   . Carotid bruit    Soft right  . Cerebral atherosclerosis 10/24/2011   Doppler - bilateral bulb/proximal ICAs all demonstrated trace to mild amts fibrous plaque w/ no evidence of significant diameter reduction, dissection or other vascular abnormality  . Chest pain, unspecified 11/29/2007   R/S MV - EF 94%; normal perfusion in all regions, global LV function normal, EKG negative for ischemia;   . Hyperlipidemia   . Hypertension   . Major depression   . Osteopenia   . Retinal hemorrhage of right eye   . TIA (transient ischemic attack) 05/14/2019   Transient aphasia  . Type 2 diabetes mellitus (HCC)   . Wet senile macular retinal degeneration (HCC)    Blind in right eye    Past Surgical History:  Procedure Laterality Date  . CATARACT EXTRACTION  07/2004, 08/2004  . DILATION AND CURETTAGE OF UTERUS  1985  . EYE SURGERY     Right eye due to bleeding   . GALLBLADDER SURGERY      FAMILY HISTORY Family History  Problem Relation Age of Onset  . Kidney disease Brother   . Heart disease Brother   . Heart attack  Brother     SOCIAL HISTORY Social History   Tobacco Use  . Smoking status: Never Smoker  . Smokeless tobacco: Never Used  Vaping Use  . Vaping Use: Never used  Substance Use Topics  . Alcohol use: No  . Drug use: No         OPHTHALMIC EXAM: Base Eye Exam    Visual Acuity (ETDRS)      Right Left   Dist [place] CF at 3' 20/60 +2   Dist ph [place] 20/200 20/30 +1       Tonometry (Tonopen, 9:48 AM)      Right Left   Pressure 14 14       Pupils      Dark Light Shape React APD   Right 5 4 Round Brisk Trace   Left 5 4 Round Brisk None       Visual Fields (Counting fingers)      Left Right    Full Full       Extraocular Movement      Right Left    Full Full       Neuro/Psych    Oriented x3: Yes   Mood/Affect: Normal        Dilation    Both eyes: 1.0% Mydriacyl, 2.5% Phenylephrine @ 9:55 AM        Slit Lamp and Fundus Exam    Slit Lamp Exam      Right Left   Lids/Lashes Normal Normal   Conjunctiva/Sclera White and quiet White and quiet   Cornea Clear Clear   Anterior Chamber Deep and quiet Deep and quiet   Iris Round and reactive Round and reactive   Lens Centered posterior chamber intraocular lens, Open posterior capsule Centered posterior chamber intraocular lens   Anterior Vitreous Normal Normal       Fundus Exam      Right Left   Posterior Vitreous Posterior vitreous detachment Posterior vitreous detachment   Disc Peripapillary atrophy Peripapillary atrophy   C/D Ratio 0.7 0.45   Macula Geographic atrophy in the foveal avascular zone 12 disc areas in size, extensor acuity, no macular thickening, no hemorrhage Geographic atrophy focal, nonsubfoveal, perifoveal not in the FAZ, Retinal pigment epithelial mottling, Hard drusen, no hemorrhage, no macular thickening   Vessels Normal Normal   Periphery Normal Normal          IMAGING AND PROCEDURES  Imaging and Procedures for 08/17/20  OCT, Retina - OU - Both Eyes       Right Eye Quality was good. Scan locations included subfoveal. Central Foveal Thickness: 258. Progression has been stable. Findings include subretinal scarring, central retinal atrophy, outer retinal atrophy.   Left Eye Quality was good. Scan locations included subfoveal. Central Foveal Thickness: 286. Progression has been stable. Findings include pigment epithelial detachment, subretinal scarring.   Notes No signs of active CNVM OU, will observe                ASSESSMENT/PLAN:  Advanced nonexudative age-related macular degeneration of right eye with subfoveal involvement Stable with little or no extension of disease  Exudative age-related macular degeneration of left eye with inactive choroidal neovascularization (HCC) Subfoveal scarring, no signs of active CNVM       ICD-10-CM   1. Exudative age-related macular degeneration of left eye with inactive choroidal neovascularization (HCC)  H35.3222 OCT, Retina - OU - Both Eyes  2. Advanced nonexudative age-related macular degeneration of right eye with subfoveal involvement  H35.3114  1.  OU with stable acuity, no signs of active CNVM,  Dry AMD may progress with enlargement of scotoma slowly over time.  2.  No active therapy warranted at this time.  Patient instructed to notify the office promptly if new onset visual acuity decline or distortion  3.  Ophthalmic Meds Ordered this visit:  No orders of the defined types were placed in this encounter.      Return in about 6 months (around 02/14/2021) for DILATE OU, COLOR FP, OCT.  There are no Patient Instructions on file for this visit.   Explained the diagnoses, plan, and follow up with the patient and they expressed understanding.  Patient expressed understanding of the importance of proper follow up care.   Clent Demark Khaleel Beckom M.D. Diseases & Surgery of the Retina and Vitreous [facility] 08/17/20     Abbreviations: M myopia (nearsighted); A astigmatism; H hyperopia (farsighted); P presbyopia; Mrx spectacle prescription;  CTL contact lenses; OD right eye; OS left eye; OU both eyes  XT exotropia; ET esotropia; PEK punctate epithelial keratitis; PEE punctate epithelial erosions; DES dry eye syndrome; MGD meibomian gland dysfunction; ATs artificial tears; PFAT's preservative free artificial tears; [place] nuclear sclerotic cataract; PSC posterior subcapsular cataract; ERM epi-retinal membrane; PVD posterior vitreous detachment; RD retinal detachment; DM diabetes mellitus; DR diabetic retinopathy; NPDR non-proliferative diabetic retinopathy; PDR proliferative diabetic retinopathy; CSME clinically significant macular edema; DME diabetic macular edema; dbh dot blot hemorrhages; CWS cotton wool spot; POAG primary open angle glaucoma; C/D  cup-to-disc ratio; HVF humphrey visual field; GVF goldmann visual field; OCT optical coherence tomography; IOP intraocular pressure; BRVO Branch retinal vein occlusion; CRVO central retinal vein occlusion; CRAO central retinal artery occlusion; BRAO branch retinal artery occlusion; RT retinal tear; SB scleral buckle; PPV pars plana vitrectomy; VH Vitreous hemorrhage; PRP panretinal laser photocoagulation; IVK intravitreal kenalog; VMT vitreomacular traction; MH Macular hole;  NVD neovascularization of the disc; NVE neovascularization elsewhere; AREDS age related eye disease study; ARMD age related macular degeneration; POAG primary open angle glaucoma; EBMD epithelial/anterior basement membrane dystrophy; ACIOL anterior chamber intraocular lens; IOL intraocular lens; PCIOL posterior chamber intraocular lens; Phaco/IOL phacoemulsification with intraocular lens placement; [place] photorefractive keratectomy; LASIK laser assisted in situ keratomileusis; HTN hypertension; DM diabetes mellitus; COPD chronic obstructive pulmonary disease

## 2020-08-17 NOTE — Assessment & Plan Note (Signed)
Stable with little or no extension of disease

## 2020-09-10 DIAGNOSIS — E1159 Type 2 diabetes mellitus with other circulatory complications: Secondary | ICD-10-CM | POA: Diagnosis not present

## 2020-09-10 DIAGNOSIS — E782 Mixed hyperlipidemia: Secondary | ICD-10-CM | POA: Diagnosis not present

## 2020-09-10 DIAGNOSIS — I10 Essential (primary) hypertension: Secondary | ICD-10-CM | POA: Diagnosis not present

## 2020-09-10 DIAGNOSIS — N1831 Chronic kidney disease, stage 3a: Secondary | ICD-10-CM | POA: Diagnosis not present

## 2020-09-16 DIAGNOSIS — F3342 Major depressive disorder, recurrent, in full remission: Secondary | ICD-10-CM | POA: Diagnosis not present

## 2020-09-16 DIAGNOSIS — I10 Essential (primary) hypertension: Secondary | ICD-10-CM | POA: Diagnosis not present

## 2020-09-16 DIAGNOSIS — E1159 Type 2 diabetes mellitus with other circulatory complications: Secondary | ICD-10-CM | POA: Diagnosis not present

## 2020-09-16 DIAGNOSIS — E782 Mixed hyperlipidemia: Secondary | ICD-10-CM | POA: Diagnosis not present

## 2020-11-09 DIAGNOSIS — E1159 Type 2 diabetes mellitus with other circulatory complications: Secondary | ICD-10-CM | POA: Diagnosis not present

## 2020-11-09 DIAGNOSIS — R350 Frequency of micturition: Secondary | ICD-10-CM | POA: Diagnosis not present

## 2020-11-09 DIAGNOSIS — E782 Mixed hyperlipidemia: Secondary | ICD-10-CM | POA: Diagnosis not present

## 2020-11-09 DIAGNOSIS — N1831 Chronic kidney disease, stage 3a: Secondary | ICD-10-CM | POA: Diagnosis not present

## 2020-11-22 DIAGNOSIS — N1831 Chronic kidney disease, stage 3a: Secondary | ICD-10-CM | POA: Diagnosis not present

## 2020-11-22 DIAGNOSIS — I10 Essential (primary) hypertension: Secondary | ICD-10-CM | POA: Diagnosis not present

## 2020-11-22 DIAGNOSIS — E1159 Type 2 diabetes mellitus with other circulatory complications: Secondary | ICD-10-CM | POA: Diagnosis not present

## 2020-11-22 DIAGNOSIS — E782 Mixed hyperlipidemia: Secondary | ICD-10-CM | POA: Diagnosis not present

## 2020-11-30 DIAGNOSIS — E782 Mixed hyperlipidemia: Secondary | ICD-10-CM | POA: Diagnosis not present

## 2020-11-30 DIAGNOSIS — E1165 Type 2 diabetes mellitus with hyperglycemia: Secondary | ICD-10-CM | POA: Diagnosis not present

## 2020-11-30 DIAGNOSIS — I10 Essential (primary) hypertension: Secondary | ICD-10-CM | POA: Diagnosis not present

## 2020-11-30 DIAGNOSIS — K219 Gastro-esophageal reflux disease without esophagitis: Secondary | ICD-10-CM | POA: Diagnosis not present

## 2021-01-18 DIAGNOSIS — I10 Essential (primary) hypertension: Secondary | ICD-10-CM | POA: Diagnosis not present

## 2021-01-18 DIAGNOSIS — F3342 Major depressive disorder, recurrent, in full remission: Secondary | ICD-10-CM | POA: Diagnosis not present

## 2021-01-18 DIAGNOSIS — E1165 Type 2 diabetes mellitus with hyperglycemia: Secondary | ICD-10-CM | POA: Diagnosis not present

## 2021-01-18 DIAGNOSIS — E782 Mixed hyperlipidemia: Secondary | ICD-10-CM | POA: Diagnosis not present

## 2021-02-15 ENCOUNTER — Encounter (INDEPENDENT_AMBULATORY_CARE_PROVIDER_SITE_OTHER): Payer: Medicare Other | Admitting: Ophthalmology

## 2021-03-14 ENCOUNTER — Encounter (INDEPENDENT_AMBULATORY_CARE_PROVIDER_SITE_OTHER): Payer: Self-pay | Admitting: Ophthalmology

## 2021-03-14 ENCOUNTER — Other Ambulatory Visit: Payer: Self-pay

## 2021-03-14 ENCOUNTER — Encounter (INDEPENDENT_AMBULATORY_CARE_PROVIDER_SITE_OTHER): Payer: Medicare Other | Admitting: Ophthalmology

## 2021-03-14 ENCOUNTER — Ambulatory Visit (INDEPENDENT_AMBULATORY_CARE_PROVIDER_SITE_OTHER): Payer: Medicare Other | Admitting: Ophthalmology

## 2021-03-14 DIAGNOSIS — H353222 Exudative age-related macular degeneration, left eye, with inactive choroidal neovascularization: Secondary | ICD-10-CM | POA: Diagnosis not present

## 2021-03-14 DIAGNOSIS — H353114 Nonexudative age-related macular degeneration, right eye, advanced atrophic with subfoveal involvement: Secondary | ICD-10-CM

## 2021-03-14 DIAGNOSIS — H43812 Vitreous degeneration, left eye: Secondary | ICD-10-CM

## 2021-03-14 DIAGNOSIS — E119 Type 2 diabetes mellitus without complications: Secondary | ICD-10-CM

## 2021-03-14 NOTE — Assessment & Plan Note (Signed)
Former vascularized PED subfoveal, mental stabilized fibrotic no active disease

## 2021-03-14 NOTE — Assessment & Plan Note (Signed)

## 2021-03-14 NOTE — Progress Notes (Signed)
03/14/2021     CHIEF COMPLAINT Patient presents for Retina Follow Up (6 mos fu ou oct fp/Patient states no changes in vision. No new FOL or floaters./Patient last A1C unknown and does not check BS at home.)   HISTORY OF PRESENT ILLNESS: Sabrina Hooper is a 87 y.o. female who presents to the clinic today for:   HPI     Retina Follow Up           Diagnosis: Wet AMD   Laterality: both eyes   Onset: 6 months ago   Severity: mild   Duration: 6 months   Course: stable   Comments: 6 mos fu ou oct fp Patient states no changes in vision. No new FOL or floaters. Patient last A1C unknown and does not check BS at home.       Last edited by Nelva NayKronstein, Anna N, COA on 03/14/2021 10:00 AM.      Referring physician: Gweneth DimitriMcNeill, Wendy, MD [address] [place] [id number]  HISTORICAL INFORMATION:   Selected notes from the MEDICAL RECORD NUMBER       CURRENT MEDICATIONS: No current outpatient medications on file. (Ophthalmic Drugs)   No current facility-administered medications for this visit. (Ophthalmic Drugs)   Current Outpatient Medications (Other)  Medication Sig   calcium carbonate (OS-CAL) 600 MG TABS Take 600 mg by mouth 2 (two) times daily with a meal.   citalopram (CELEXA) 20 MG tablet Take 20 mg by mouth daily.   glipiZIDE (GLUCOTROL) 5 MG tablet Take 5 mg by mouth daily.   losartan (COZAAR) 50 MG tablet Take 50 mg by mouth daily.   metFORMIN (GLUCOPHAGE) 500 MG tablet Take 500 mg by mouth 2 (two) times daily with a meal.   Multiple Vitamins-Minerals (PRESERVISION/LUTEIN PO) Take by mouth.   omeprazole (PRILOSEC) 20 MG capsule Take 20 mg by mouth daily.   simvastatin (ZOCOR) 20 MG tablet    No current facility-administered medications for this visit. (Other)      REVIEW OF SYSTEMS:    ALLERGIES Allergies  Allergen Reactions   Codeine Nausea And Vomiting and Other (See Comments)    GI upset    PAST MEDICAL HISTORY Past Medical History:  Diagnosis  Date   Acute gastritis    Blood in stool    Carotid bruit    Soft right   Cerebral atherosclerosis 10/24/2011   Doppler - bilateral bulb/proximal ICAs all demonstrated trace to mild amts fibrous plaque w/ no evidence of significant diameter reduction, dissection or other vascular abnormality   Chest pain, unspecified 11/29/2007   R/S MV - EF 94%; normal perfusion in all regions, global LV function normal, EKG negative for ischemia;    Hyperlipidemia    Hypertension    Major depression    Osteopenia    Retinal hemorrhage of right eye    TIA (transient ischemic attack) 05/14/2019   Transient aphasia   Type 2 diabetes mellitus (HCC)    Wet senile macular retinal degeneration (HCC)    Blind in right eye    Past Surgical History:  Procedure Laterality Date   CATARACT EXTRACTION  07/2004, 08/2004   DILATION AND CURETTAGE OF UTERUS  1985   EYE SURGERY     Right eye due to bleeding    GALLBLADDER SURGERY      FAMILY HISTORY Family History  Problem Relation Age of Onset   Kidney disease Brother    Heart disease Brother    Heart attack Brother  SOCIAL HISTORY Social History   Tobacco Use   Smoking status: Never   Smokeless tobacco: Never  Vaping Use   Vaping Use: Never used  Substance Use Topics   Alcohol use: No   Drug use: No         OPHTHALMIC EXAM:  Base Eye Exam     Visual Acuity (ETDRS)       Right Left   Dist [place] CF at 3' 20/50 -2   Dist ph [place]  20/30 -2         Tonometry (Tonopen, 10:02 AM)       Right Left   Pressure 13 12         Pupils       Pupils Dark Light Shape React APD   Right PERRL 4 3 Round Brisk None   Left PERRL 4 3 Round Brisk None         Visual Fields       Left Right    Full Full         Extraocular Movement       Right Left    Full Full         Neuro/Psych     Oriented x3: Yes   Mood/Affect: Normal         Dilation     Both eyes: 1.0% Mydriacyl, 2.5% Phenylephrine @ 10:03 AM           Slit  Lamp and Fundus Exam     External Exam       Right Left   External Normal Normal         Slit Lamp Exam       Right Left   Lids/Lashes Normal Normal   Conjunctiva/Sclera White and quiet White and quiet   Cornea Clear Clear   Anterior Chamber Deep and quiet Deep and quiet   Iris Round and reactive Round and reactive   Lens Centered posterior chamber intraocular lens, Open posterior capsule Centered posterior chamber intraocular lens   Anterior Vitreous Normal Normal         Fundus Exam       Right Left   Posterior Vitreous Posterior vitreous detachment Posterior vitreous detachment   Disc Peripapillary atrophy Peripapillary atrophy   C/D Ratio 0.7 0.45   Macula Geographic atrophy in the foveal avascular zone 12 disc areas in size, extensor acuity, no macular thickening, no hemorrhage Geographic atrophy focal, nonsubfoveal, perifoveal not in the FAZ, Retinal pigment epithelial mottling, Hard drusen, no hemorrhage, no macular thickening   Vessels Normal Normal   Periphery Normal Normal            IMAGING AND PROCEDURES  Imaging and Procedures for 03/14/21  OCT, Retina - OU - Both Eyes       Right Eye Quality was good. Scan locations included subfoveal. Central Foveal Thickness: 268. Progression has been stable. Findings include subretinal scarring, central retinal atrophy, outer retinal atrophy.   Left Eye Quality was good. Scan locations included subfoveal. Central Foveal Thickness: 288. Progression has been stable. Findings include pigment epithelial detachment, subretinal scarring.   Notes No signs of active CNVM OU, will observe     Color Fundus Photography Optos - OU - Both Eyes       Right Eye Progression has been stable. Disc findings include normal observations. Macula : geographic atrophy, drusen. Vessels : normal observations. Periphery : normal observations.   Left Eye Progression has been stable. Disc findings include normal observations.  Macula : drusen, geographic atrophy. Vessels : normal observations. Periphery : normal observations.              ASSESSMENT/PLAN:  Advanced nonexudative age-related macular degeneration of right eye with subfoveal involvement Stable no active edges accounts for acuity subfoveal atrophy  Exudative age-related macular degeneration of left eye with inactive choroidal neovascularization (HCC) Former vascularized PED subfoveal, mental stabilized fibrotic no active disease  Diabetes mellitus without complication (HCC) No DR OU  Posterior vitreous detachment of left eye  The nature of posterior vitreous detachment was discussed with the patient as well as its physiology, its age prevalence, and its possible implication regarding retinal breaks and detachment.  An informational brochure was offered to the patient.  All the patient's questions were answered.  The patient was asked to return if new or different flashes or floaters develops.   Patient was instructed to contact office immediately if any new changes were noticed. I explained to the patient that vitreous inside the eye is similar to jello inside a bowl. As the jello melts it can start to pull away from the bowl, similarly the vitreous throughout our lives can begin to pull away from the retina. That process is called a posterior vitreous detachment. In some cases, the vitreous can tug hard enough on the retina to form a retinal tear. I discussed with the patient the signs and symptoms of a retinal detachment.  Do not rub the eye.      ICD-10-CM   1. Exudative age-related macular degeneration of left eye with inactive choroidal neovascularization (HCC)  H35.3222 OCT, Retina - OU - Both Eyes    Color Fundus Photography Optos - OU - Both Eyes    2. Diabetes mellitus without complication (HCC)  E11.9 OCT, Retina - OU - Both Eyes    3. Advanced nonexudative age-related macular degeneration of right eye with subfoveal involvement   H35.3114     4. Posterior vitreous detachment of left eye  H43.812       1.  Patient instructed to contact the office promptly for new onset visual acuity declines or distortions  2.  3.  Ophthalmic Meds Ordered this visit:  No orders of the defined types were placed in this encounter.      Return in about 8 months (around 11/12/2021) for DILATE OU, COLOR FP, OCT.  Patient Instructions  The nature of age--related macular degeneration was discussed with the patient as well as the distinction between dry and wet types. Checking an Amsler Grid daily with advice to return immediately should a distortion develop, was given to the patient. The patient 's smoking status now and in the past was determined and advice based on the AREDS study was provided regarding the consumption of antioxidant supplements. AREDS 2 vitamin formulation was recommended. Consumption of dark leafy vegetables and fresh fruits of various colors was recommended. Treatment modalities for wet macular degeneration particularly the use of intravitreal injections of anti-blood vessel growth factors was discussed with the patient. Avastin, Lucentis, and Eylea are the available options. On occasion, therapy includes the use of photodynamic therapy and thermal laser. Stressed to the patient do not rub eyes.  Patient was advised to check Amsler Grid daily and return immediately if changes are noted. Instructions on using the grid were given to the patient. All patient questions were answered.    Explained the diagnoses, plan, and follow up with the patient and they expressed understanding.  Patient expressed understanding of the importance of  proper follow up care.   Alford Highland Lekita Kerekes M.D. Diseases & Surgery of the Retina and Vitreous [facility] 03/14/21     Abbreviations: M myopia (nearsighted); A astigmatism; H hyperopia (farsighted); P presbyopia; Mrx spectacle prescription;  CTL contact lenses; OD right  eye; OS left eye; OU both eyes  XT exotropia; ET esotropia; PEK punctate epithelial keratitis; PEE punctate epithelial erosions; DES dry eye syndrome; MGD meibomian gland dysfunction; ATs artificial tears; PFAT's preservative free artificial tears; NSC nuclear sclerotic cataract; PSC posterior subcapsular cataract; ERM epi-retinal membrane; PVD posterior vitreous detachment; RD retinal detachment; DM diabetes mellitus; DR diabetic retinopathy; NPDR non-proliferative diabetic retinopathy; PDR proliferative diabetic retinopathy; CSME clinically significant macular edema; DME diabetic macular edema; dbh dot blot hemorrhages; CWS cotton wool spot; POAG primary open angle glaucoma; C/D cup-to-disc ratio; HVF humphrey visual field; GVF goldmann visual field; OCT optical coherence tomography; IOP intraocular pressure; BRVO Branch retinal vein occlusion; CRVO central retinal vein occlusion; CRAO central retinal artery occlusion; BRAO branch retinal artery occlusion; RT retinal tear; SB scleral buckle; PPV pars plana vitrectomy; VH Vitreous hemorrhage; PRP panretinal laser photocoagulation; IVK intravitreal kenalog; VMT vitreomacular traction; MH Macular hole;  NVD neovascularization of the disc; NVE neovascularization elsewhere; AREDS age related eye disease study; ARMD age related macular degeneration; POAG primary open angle glaucoma; EBMD epithelial/anterior basement membrane dystrophy; ACIOL anterior chamber intraocular lens; IOL intraocular lens; PCIOL posterior chamber intraocular lens; Phaco/IOL phacoemulsification with intraocular lens placement; PRK photorefractive keratectomy; LASIK laser assisted in situ keratomileusis; HTN hypertension; DM diabetes mellitus; COPD chronic obstructive pulmonary disease

## 2021-03-14 NOTE — Assessment & Plan Note (Signed)
No DR OU 

## 2021-03-14 NOTE — Patient Instructions (Signed)

## 2021-03-14 NOTE — Assessment & Plan Note (Signed)
Stable no active edges accounts for acuity subfoveal atrophy

## 2021-03-18 DIAGNOSIS — E782 Mixed hyperlipidemia: Secondary | ICD-10-CM | POA: Diagnosis not present

## 2021-03-18 DIAGNOSIS — I10 Essential (primary) hypertension: Secondary | ICD-10-CM | POA: Diagnosis not present

## 2021-03-18 DIAGNOSIS — N1831 Chronic kidney disease, stage 3a: Secondary | ICD-10-CM | POA: Diagnosis not present

## 2021-03-18 DIAGNOSIS — E1165 Type 2 diabetes mellitus with hyperglycemia: Secondary | ICD-10-CM | POA: Diagnosis not present

## 2021-03-21 DIAGNOSIS — L570 Actinic keratosis: Secondary | ICD-10-CM | POA: Diagnosis not present

## 2021-03-21 DIAGNOSIS — L821 Other seborrheic keratosis: Secondary | ICD-10-CM | POA: Diagnosis not present

## 2021-03-21 DIAGNOSIS — Z85828 Personal history of other malignant neoplasm of skin: Secondary | ICD-10-CM | POA: Diagnosis not present

## 2021-05-19 DIAGNOSIS — E1165 Type 2 diabetes mellitus with hyperglycemia: Secondary | ICD-10-CM | POA: Diagnosis not present

## 2021-05-19 DIAGNOSIS — E782 Mixed hyperlipidemia: Secondary | ICD-10-CM | POA: Diagnosis not present

## 2021-05-19 DIAGNOSIS — I10 Essential (primary) hypertension: Secondary | ICD-10-CM | POA: Diagnosis not present

## 2021-05-19 DIAGNOSIS — N1831 Chronic kidney disease, stage 3a: Secondary | ICD-10-CM | POA: Diagnosis not present

## 2021-06-28 DIAGNOSIS — I10 Essential (primary) hypertension: Secondary | ICD-10-CM | POA: Diagnosis not present

## 2021-06-28 DIAGNOSIS — E782 Mixed hyperlipidemia: Secondary | ICD-10-CM | POA: Diagnosis not present

## 2021-06-28 DIAGNOSIS — E1159 Type 2 diabetes mellitus with other circulatory complications: Secondary | ICD-10-CM | POA: Diagnosis not present

## 2021-06-28 DIAGNOSIS — E1165 Type 2 diabetes mellitus with hyperglycemia: Secondary | ICD-10-CM | POA: Diagnosis not present

## 2021-07-18 DIAGNOSIS — Z1389 Encounter for screening for other disorder: Secondary | ICD-10-CM | POA: Diagnosis not present

## 2021-07-18 DIAGNOSIS — Z0000 Encounter for general adult medical examination without abnormal findings: Secondary | ICD-10-CM | POA: Diagnosis not present

## 2021-07-25 DIAGNOSIS — Z7984 Long term (current) use of oral hypoglycemic drugs: Secondary | ICD-10-CM | POA: Diagnosis not present

## 2021-07-25 DIAGNOSIS — E538 Deficiency of other specified B group vitamins: Secondary | ICD-10-CM | POA: Diagnosis not present

## 2021-07-25 DIAGNOSIS — I10 Essential (primary) hypertension: Secondary | ICD-10-CM | POA: Diagnosis not present

## 2021-07-25 DIAGNOSIS — E1159 Type 2 diabetes mellitus with other circulatory complications: Secondary | ICD-10-CM | POA: Diagnosis not present

## 2021-07-25 DIAGNOSIS — E782 Mixed hyperlipidemia: Secondary | ICD-10-CM | POA: Diagnosis not present

## 2021-07-25 DIAGNOSIS — K219 Gastro-esophageal reflux disease without esophagitis: Secondary | ICD-10-CM | POA: Diagnosis not present

## 2021-10-25 DIAGNOSIS — E1159 Type 2 diabetes mellitus with other circulatory complications: Secondary | ICD-10-CM | POA: Diagnosis not present

## 2021-10-25 DIAGNOSIS — E1165 Type 2 diabetes mellitus with hyperglycemia: Secondary | ICD-10-CM | POA: Diagnosis not present

## 2021-11-14 ENCOUNTER — Other Ambulatory Visit: Payer: Self-pay

## 2021-11-14 ENCOUNTER — Encounter (INDEPENDENT_AMBULATORY_CARE_PROVIDER_SITE_OTHER): Payer: Self-pay | Admitting: Ophthalmology

## 2021-11-14 ENCOUNTER — Encounter (INDEPENDENT_AMBULATORY_CARE_PROVIDER_SITE_OTHER): Payer: Medicare Other | Admitting: Ophthalmology

## 2021-11-14 ENCOUNTER — Ambulatory Visit (INDEPENDENT_AMBULATORY_CARE_PROVIDER_SITE_OTHER): Payer: Medicare Other | Admitting: Ophthalmology

## 2021-11-14 DIAGNOSIS — H353222 Exudative age-related macular degeneration, left eye, with inactive choroidal neovascularization: Secondary | ICD-10-CM | POA: Diagnosis not present

## 2021-11-14 DIAGNOSIS — H353114 Nonexudative age-related macular degeneration, right eye, advanced atrophic with subfoveal involvement: Secondary | ICD-10-CM

## 2021-11-14 NOTE — Patient Instructions (Signed)
Patient was advised to check Amsler Grid daily and return immediately if changes are noted. Instructions on using the grid were given to the patient. 

## 2021-11-14 NOTE — Assessment & Plan Note (Signed)
Central geographic atrophy OD accounts for acuity 

## 2021-11-14 NOTE — Assessment & Plan Note (Signed)
No sign of recurrence of wet AMD OS ?

## 2021-11-14 NOTE — Progress Notes (Signed)
? ? ?11/14/2021 ? ?  ? ?CHIEF COMPLAINT ?Patient presents for  ?Chief Complaint  ?Patient presents with  ? Macular Degeneration  ? ? ? ? ?HISTORY OF PRESENT ILLNESS: ?Sabrina Hooper is a 88 y.o. female who presents to the clinic today for:  ? ?HPI   ?8 mos fu ou oct fp. ?Pt denies floaters and FOL. ?Pt stated no changes in vision. ? ? ?Last edited by Angeline SlimMa, San on 11/14/2021 10:38 AM.  ?  ? ? ?Referring physician: ?Gweneth DimitriMcNeill, Wendy, MD ?[address] ?[place],  [place] [id number] ? ?HISTORICAL INFORMATION:  ? ?Selected notes from the MEDICAL RECORD NUMBER ?  ?   ? ?CURRENT MEDICATIONS: ?No current outpatient medications on file. (Ophthalmic Drugs)  ? ?No current facility-administered medications for this visit. (Ophthalmic Drugs)  ? ?Current Outpatient Medications (Other)  ?Medication Sig  ? calcium carbonate (OS-CAL) 600 MG TABS Take 600 mg by mouth 2 (two) times daily with a meal.  ? citalopram (CELEXA) 20 MG tablet Take 20 mg by mouth daily.  ? glipiZIDE (GLUCOTROL) 5 MG tablet Take 5 mg by mouth daily.  ? losartan (COZAAR) 50 MG tablet Take 50 mg by mouth daily.  ? metFORMIN (GLUCOPHAGE) 500 MG tablet Take 500 mg by mouth 2 (two) times daily with a meal.  ? Multiple Vitamins-Minerals (PRESERVISION/LUTEIN PO) Take by mouth.  ? omeprazole (PRILOSEC) 20 MG capsule Take 20 mg by mouth daily.  ? simvastatin (ZOCOR) 20 MG tablet   ? ?No current facility-administered medications for this visit. (Other)  ? ? ? ? ?REVIEW OF SYSTEMS: ?ROS   ?Negative for: Constitutional, Gastrointestinal, Neurological, Skin, Genitourinary, Musculoskeletal, HENT, Endocrine, Cardiovascular, Eyes, Respiratory, Psychiatric, Allergic/Imm, Heme/Lymph ?Last edited by Angeline SlimMa, San on 11/14/2021 10:38 AM.  ?  ? ? ? ?ALLERGIES ?Allergies  ?Allergen Reactions  ? Codeine Nausea And Vomiting and Other (See Comments)  ?  GI upset  ? ? ?PAST MEDICAL HISTORY ?Past Medical History:  ?Diagnosis Date  ? Acute gastritis   ? Blood in stool   ? Carotid bruit   ? Soft right  ?  Cerebral atherosclerosis 10/24/2011  ? Doppler - bilateral bulb/proximal ICAs all demonstrated trace to mild amts fibrous plaque w/ no evidence of significant diameter reduction, dissection or other vascular abnormality  ? Chest pain, unspecified 11/29/2007  ? R/S MV - EF 94%; normal perfusion in all regions, global LV function normal, EKG negative for ischemia;   ? Hyperlipidemia   ? Hypertension   ? Major depression   ? Osteopenia   ? Retinal hemorrhage of right eye   ? TIA (transient ischemic attack) 05/14/2019  ? Transient aphasia  ? Type 2 diabetes mellitus (HCC)   ? Wet senile macular retinal degeneration (HCC)   ? Blind in right eye   ? ?Past Surgical History:  ?Procedure Laterality Date  ? CATARACT EXTRACTION  07/2004, 08/2004  ? DILATION AND CURETTAGE OF UTERUS  1985  ? EYE SURGERY    ? Right eye due to bleeding   ? GALLBLADDER SURGERY    ? ? ?FAMILY HISTORY ?Family History  ?Problem Relation Age of Onset  ? Kidney disease Brother   ? Heart disease Brother   ? Heart attack Brother   ? ? ?SOCIAL HISTORY ?Social History  ? ?Tobacco Use  ? Smoking status: Never  ? Smokeless tobacco: Never  ?Vaping Use  ? Vaping Use: Never used  ?Substance Use Topics  ? Alcohol use: No  ? Drug use: No  ? ?  ? ?  ? ?  OPHTHALMIC EXAM: ? ?Base Eye Exam   ? ? Visual Acuity (ETDRS)   ? ?   Right Left  ? Dist [place] CF at 3' 20/60 -1  ? Dist ph [place]  20/30 -2  ? ?  ?  ? ? Tonometry (Tonopen, 10:45 AM)   ? ?   Right Left  ? Pressure 19 17  ? ?  ?  ? ? Pupils   ? ?   Pupils Dark Light Shape React APD  ? Right PERRL 4 3 Round Brisk None  ? Left PERRL 4 3 Round Brisk None  ? ?  ?  ? ? Visual Fields   ? ?   Left Right  ?  Full Full  ? ?  ?  ? ? Extraocular Movement   ? ?   Right Left  ?  Full Full  ? ?  ?  ? ? Neuro/Psych   ? ? Oriented x3: Yes  ? Mood/Affect: Normal  ? ?  ?  ? ? Dilation   ? ? Both eyes: 1.0% Mydriacyl, 2.5% Phenylephrine @ 10:45 AM  ? ?  ?  ? ?  ? ?Slit Lamp and Fundus Exam   ? ? External Exam   ? ?   Right Left  ? External Normal  Normal  ? ?  ?  ? ? Slit Lamp Exam   ? ?   Right Left  ? Lids/Lashes Normal Normal  ? Conjunctiva/Sclera White and quiet White and quiet  ? Cornea Clear Clear  ? Anterior Chamber Deep and quiet Deep and quiet  ? Iris Round and reactive Round and reactive  ? Lens Centered posterior chamber intraocular lens, Open posterior capsule Centered posterior chamber intraocular lens  ? Anterior Vitreous Normal Normal  ? ?  ?  ? ? Fundus Exam   ? ?   Right Left  ? Posterior Vitreous Posterior vitreous detachment Posterior vitreous detachment  ? Disc Peripapillary atrophy Peripapillary atrophy  ? C/D Ratio 0.7 0.45  ? Macula Geographic atrophy in the foveal avascular zone 12 disc areas in size, accounts for acuity, extends from the peripapillary atrophy no macular thickening, no hemorrhage Geographic atrophy focal, nonsubfoveal, perifoveal not in the FAZ, Retinal pigment epithelial mottling, Hard drusen, no hemorrhage, no macular thickening  ? Vessels Normal Normal  ? Periphery Normal Normal  ? ?  ?  ? ?  ? ? ?IMAGING AND PROCEDURES  ?Imaging and Procedures for 11/14/21 ? ?OCT, Retina - OU - Both Eyes   ? ?   ?Right Eye ?Quality was good. Scan locations included subfoveal. Central Foveal Thickness: 249. Progression has been stable. Findings include subretinal scarring, central retinal atrophy, outer retinal atrophy.  ? ?Left Eye ?Quality was good. Scan locations included subfoveal. Central Foveal Thickness: 270. Progression has been stable. Findings include pigment epithelial detachment, subretinal scarring.  ? ?Notes ?No signs of active CNVM OU, will observe ? ?  ? ?Color Fundus Photography Optos - OU - Both Eyes   ? ?   ?Right Eye ?Progression has been stable. Disc findings include normal observations. Macula : geographic atrophy, drusen. Vessels : normal observations. Periphery : normal observations.  ? ?Left Eye ?Progression has been stable. Disc findings include normal observations. Macula : drusen, geographic atrophy.  Vessels : normal observations. Periphery : normal observations.  ? ?Notes ?Peripapillary atrophy extends geographic atrophy into the fovea and old disciform scar OD accounts for acuity no change over time ? ?Mild geographic atrophy located centrally  left eye, no signs of recurrence of CNVM ? ?  ? ? ?  ?  ? ?  ?ASSESSMENT/PLAN: ? ?Exudative age-related macular degeneration of left eye with inactive choroidal neovascularization (HCC) ?No sign of recurrence of wet AMD OS ? ?Advanced nonexudative age-related macular degeneration of right eye with subfoveal involvement ?Central geographic atrophy OD accounts for acuity  ? ?  ICD-10-CM   ?1. Exudative age-related macular degeneration of left eye with inactive choroidal neovascularization (HCC)  H35.3222 OCT, Retina - OU - Both Eyes  ?  Color Fundus Photography Optos - OU - Both Eyes  ?  ?2. Advanced nonexudative age-related macular degeneration of right eye with subfoveal involvement  H35.3114   ?  ? ? ?1.  OD with geographic atrophy account of count fingers vision but no sign of CNVM recurrence ? ?2.  OS with old Sub foveal PED, and also limited atrophy yet with preserved acuity no sign of CNVM will continue to observe ? ?3. ? ?Ophthalmic Meds Ordered this visit:  ?No orders of the defined types were placed in this encounter. ? ? ?  ? ?Return in about 7 months (around 06/16/2022) for DILATE OU, COLOR FP, OCT. ? ?Patient Instructions  ?Patient was advised to check Amsler Grid daily and return immediately if changes are noted. Instructions on using the grid were given to the patient.  ? ? ?Explained the diagnoses, plan, and follow up with the patient and they expressed understanding.  Patient expressed understanding of the importance of proper follow up care.  ? ?Alford Highland. Jaeshaun Riva M.D. ?Diseases & Surgery of the Retina and Vitreous ?[facility] ?11/14/21 ? ? ? ? ?Abbreviations: ?M myopia (nearsighted); A astigmatism; H hyperopia (farsighted); P presbyopia; Mrx  spectacle prescription;  CTL contact lenses; OD right eye; OS left eye; OU both eyes  XT exotropia; ET esotropia; PEK punctate epithelial keratitis; PEE punctate epithelial erosions; DES dry eye syndrome; MG

## 2022-06-27 ENCOUNTER — Encounter (INDEPENDENT_AMBULATORY_CARE_PROVIDER_SITE_OTHER): Payer: Medicare Other | Admitting: Ophthalmology

## 2022-07-10 DIAGNOSIS — M25511 Pain in right shoulder: Secondary | ICD-10-CM | POA: Diagnosis not present

## 2022-07-19 DIAGNOSIS — Z1389 Encounter for screening for other disorder: Secondary | ICD-10-CM | POA: Diagnosis not present

## 2022-07-19 DIAGNOSIS — Z6824 Body mass index (BMI) 24.0-24.9, adult: Secondary | ICD-10-CM | POA: Diagnosis not present

## 2022-07-19 DIAGNOSIS — Z0000 Encounter for general adult medical examination without abnormal findings: Secondary | ICD-10-CM | POA: Diagnosis not present

## 2022-07-19 DIAGNOSIS — Z23 Encounter for immunization: Secondary | ICD-10-CM | POA: Diagnosis not present

## 2022-10-04 DIAGNOSIS — H35363 Drusen (degenerative) of macula, bilateral: Secondary | ICD-10-CM | POA: Diagnosis not present

## 2022-10-04 DIAGNOSIS — H353212 Exudative age-related macular degeneration, right eye, with inactive choroidal neovascularization: Secondary | ICD-10-CM | POA: Diagnosis not present

## 2022-10-04 DIAGNOSIS — H353122 Nonexudative age-related macular degeneration, left eye, intermediate dry stage: Secondary | ICD-10-CM | POA: Diagnosis not present

## 2022-10-04 DIAGNOSIS — H53411 Scotoma involving central area, right eye: Secondary | ICD-10-CM | POA: Diagnosis not present

## 2022-11-30 DIAGNOSIS — Z85828 Personal history of other malignant neoplasm of skin: Secondary | ICD-10-CM | POA: Diagnosis not present

## 2022-11-30 DIAGNOSIS — L821 Other seborrheic keratosis: Secondary | ICD-10-CM | POA: Diagnosis not present

## 2022-11-30 DIAGNOSIS — L570 Actinic keratosis: Secondary | ICD-10-CM | POA: Diagnosis not present

## 2022-11-30 DIAGNOSIS — L812 Freckles: Secondary | ICD-10-CM | POA: Diagnosis not present

## 2023-03-29 DIAGNOSIS — H353123 Nonexudative age-related macular degeneration, left eye, advanced atrophic without subfoveal involvement: Secondary | ICD-10-CM | POA: Diagnosis not present

## 2023-03-29 DIAGNOSIS — H353212 Exudative age-related macular degeneration, right eye, with inactive choroidal neovascularization: Secondary | ICD-10-CM | POA: Diagnosis not present

## 2023-03-29 DIAGNOSIS — H353114 Nonexudative age-related macular degeneration, right eye, advanced atrophic with subfoveal involvement: Secondary | ICD-10-CM | POA: Diagnosis not present

## 2023-04-26 DIAGNOSIS — H353114 Nonexudative age-related macular degeneration, right eye, advanced atrophic with subfoveal involvement: Secondary | ICD-10-CM | POA: Diagnosis not present

## 2023-04-26 DIAGNOSIS — H353123 Nonexudative age-related macular degeneration, left eye, advanced atrophic without subfoveal involvement: Secondary | ICD-10-CM | POA: Diagnosis not present

## 2023-04-26 DIAGNOSIS — H353212 Exudative age-related macular degeneration, right eye, with inactive choroidal neovascularization: Secondary | ICD-10-CM | POA: Diagnosis not present

## 2023-06-06 DIAGNOSIS — H353114 Nonexudative age-related macular degeneration, right eye, advanced atrophic with subfoveal involvement: Secondary | ICD-10-CM | POA: Diagnosis not present

## 2023-06-06 DIAGNOSIS — H353123 Nonexudative age-related macular degeneration, left eye, advanced atrophic without subfoveal involvement: Secondary | ICD-10-CM | POA: Diagnosis not present

## 2023-06-06 DIAGNOSIS — H353212 Exudative age-related macular degeneration, right eye, with inactive choroidal neovascularization: Secondary | ICD-10-CM | POA: Diagnosis not present

## 2023-06-26 DIAGNOSIS — Z85828 Personal history of other malignant neoplasm of skin: Secondary | ICD-10-CM | POA: Diagnosis not present

## 2023-06-26 DIAGNOSIS — D044 Carcinoma in situ of skin of scalp and neck: Secondary | ICD-10-CM | POA: Diagnosis not present

## 2023-06-26 DIAGNOSIS — L821 Other seborrheic keratosis: Secondary | ICD-10-CM | POA: Diagnosis not present

## 2023-06-26 DIAGNOSIS — L570 Actinic keratosis: Secondary | ICD-10-CM | POA: Diagnosis not present

## 2023-07-16 DIAGNOSIS — H353123 Nonexudative age-related macular degeneration, left eye, advanced atrophic without subfoveal involvement: Secondary | ICD-10-CM | POA: Diagnosis not present

## 2023-07-16 DIAGNOSIS — H353212 Exudative age-related macular degeneration, right eye, with inactive choroidal neovascularization: Secondary | ICD-10-CM | POA: Diagnosis not present

## 2023-07-16 DIAGNOSIS — H353114 Nonexudative age-related macular degeneration, right eye, advanced atrophic with subfoveal involvement: Secondary | ICD-10-CM | POA: Diagnosis not present

## 2023-07-23 DIAGNOSIS — E782 Mixed hyperlipidemia: Secondary | ICD-10-CM | POA: Diagnosis not present

## 2023-07-23 DIAGNOSIS — Z6826 Body mass index (BMI) 26.0-26.9, adult: Secondary | ICD-10-CM | POA: Diagnosis not present

## 2023-07-23 DIAGNOSIS — E538 Deficiency of other specified B group vitamins: Secondary | ICD-10-CM | POA: Diagnosis not present

## 2023-07-23 DIAGNOSIS — Z0000 Encounter for general adult medical examination without abnormal findings: Secondary | ICD-10-CM | POA: Diagnosis not present

## 2023-07-23 DIAGNOSIS — Z1331 Encounter for screening for depression: Secondary | ICD-10-CM | POA: Diagnosis not present

## 2023-07-23 DIAGNOSIS — N1831 Chronic kidney disease, stage 3a: Secondary | ICD-10-CM | POA: Diagnosis not present

## 2023-07-23 DIAGNOSIS — E1159 Type 2 diabetes mellitus with other circulatory complications: Secondary | ICD-10-CM | POA: Diagnosis not present

## 2023-07-30 DIAGNOSIS — N1831 Chronic kidney disease, stage 3a: Secondary | ICD-10-CM | POA: Diagnosis not present

## 2023-07-30 DIAGNOSIS — Z9181 History of falling: Secondary | ICD-10-CM | POA: Diagnosis not present

## 2023-07-30 DIAGNOSIS — F331 Major depressive disorder, recurrent, moderate: Secondary | ICD-10-CM | POA: Diagnosis not present

## 2023-07-30 DIAGNOSIS — I10 Essential (primary) hypertension: Secondary | ICD-10-CM | POA: Diagnosis not present

## 2023-07-30 DIAGNOSIS — E1165 Type 2 diabetes mellitus with hyperglycemia: Secondary | ICD-10-CM | POA: Diagnosis not present

## 2023-08-30 DIAGNOSIS — R197 Diarrhea, unspecified: Secondary | ICD-10-CM | POA: Diagnosis not present

## 2023-08-30 DIAGNOSIS — K30 Functional dyspepsia: Secondary | ICD-10-CM | POA: Diagnosis not present

## 2023-09-05 DIAGNOSIS — N179 Acute kidney failure, unspecified: Secondary | ICD-10-CM | POA: Diagnosis not present

## 2023-09-20 DIAGNOSIS — N1831 Chronic kidney disease, stage 3a: Secondary | ICD-10-CM | POA: Diagnosis not present

## 2023-09-20 DIAGNOSIS — E1165 Type 2 diabetes mellitus with hyperglycemia: Secondary | ICD-10-CM | POA: Diagnosis not present

## 2023-09-20 DIAGNOSIS — K30 Functional dyspepsia: Secondary | ICD-10-CM | POA: Diagnosis not present

## 2023-09-20 DIAGNOSIS — I10 Essential (primary) hypertension: Secondary | ICD-10-CM | POA: Diagnosis not present

## 2023-09-25 DIAGNOSIS — H353212 Exudative age-related macular degeneration, right eye, with inactive choroidal neovascularization: Secondary | ICD-10-CM | POA: Diagnosis not present

## 2023-09-25 DIAGNOSIS — H353114 Nonexudative age-related macular degeneration, right eye, advanced atrophic with subfoveal involvement: Secondary | ICD-10-CM | POA: Diagnosis not present

## 2023-09-25 DIAGNOSIS — H353123 Nonexudative age-related macular degeneration, left eye, advanced atrophic without subfoveal involvement: Secondary | ICD-10-CM | POA: Diagnosis not present

## 2023-09-25 DIAGNOSIS — H02833 Dermatochalasis of right eye, unspecified eyelid: Secondary | ICD-10-CM | POA: Diagnosis not present

## 2023-09-25 DIAGNOSIS — H02836 Dermatochalasis of left eye, unspecified eyelid: Secondary | ICD-10-CM | POA: Diagnosis not present

## 2023-11-08 DIAGNOSIS — H353212 Exudative age-related macular degeneration, right eye, with inactive choroidal neovascularization: Secondary | ICD-10-CM | POA: Diagnosis not present

## 2023-11-08 DIAGNOSIS — H02833 Dermatochalasis of right eye, unspecified eyelid: Secondary | ICD-10-CM | POA: Diagnosis not present

## 2023-11-08 DIAGNOSIS — H35352 Cystoid macular degeneration, left eye: Secondary | ICD-10-CM | POA: Diagnosis not present

## 2023-11-08 DIAGNOSIS — H353114 Nonexudative age-related macular degeneration, right eye, advanced atrophic with subfoveal involvement: Secondary | ICD-10-CM | POA: Diagnosis not present

## 2023-11-08 DIAGNOSIS — H353123 Nonexudative age-related macular degeneration, left eye, advanced atrophic without subfoveal involvement: Secondary | ICD-10-CM | POA: Diagnosis not present

## 2023-11-12 DIAGNOSIS — E1165 Type 2 diabetes mellitus with hyperglycemia: Secondary | ICD-10-CM | POA: Diagnosis not present

## 2023-11-12 DIAGNOSIS — F331 Major depressive disorder, recurrent, moderate: Secondary | ICD-10-CM | POA: Diagnosis not present

## 2023-11-12 DIAGNOSIS — N1831 Chronic kidney disease, stage 3a: Secondary | ICD-10-CM | POA: Diagnosis not present

## 2023-11-12 DIAGNOSIS — I10 Essential (primary) hypertension: Secondary | ICD-10-CM | POA: Diagnosis not present

## 2023-11-12 DIAGNOSIS — R600 Localized edema: Secondary | ICD-10-CM | POA: Diagnosis not present

## 2023-11-12 DIAGNOSIS — R7989 Other specified abnormal findings of blood chemistry: Secondary | ICD-10-CM | POA: Diagnosis not present

## 2023-12-19 DIAGNOSIS — H35352 Cystoid macular degeneration, left eye: Secondary | ICD-10-CM | POA: Diagnosis not present

## 2023-12-19 DIAGNOSIS — H353123 Nonexudative age-related macular degeneration, left eye, advanced atrophic without subfoveal involvement: Secondary | ICD-10-CM | POA: Diagnosis not present

## 2023-12-19 DIAGNOSIS — H353212 Exudative age-related macular degeneration, right eye, with inactive choroidal neovascularization: Secondary | ICD-10-CM | POA: Diagnosis not present

## 2024-01-21 DIAGNOSIS — L570 Actinic keratosis: Secondary | ICD-10-CM | POA: Diagnosis not present

## 2024-01-30 DIAGNOSIS — H35352 Cystoid macular degeneration, left eye: Secondary | ICD-10-CM | POA: Diagnosis not present

## 2024-01-30 DIAGNOSIS — H353212 Exudative age-related macular degeneration, right eye, with inactive choroidal neovascularization: Secondary | ICD-10-CM | POA: Diagnosis not present

## 2024-01-30 DIAGNOSIS — H353123 Nonexudative age-related macular degeneration, left eye, advanced atrophic without subfoveal involvement: Secondary | ICD-10-CM | POA: Diagnosis not present

## 2024-01-30 DIAGNOSIS — H353114 Nonexudative age-related macular degeneration, right eye, advanced atrophic with subfoveal involvement: Secondary | ICD-10-CM | POA: Diagnosis not present

## 2024-01-30 DIAGNOSIS — H02833 Dermatochalasis of right eye, unspecified eyelid: Secondary | ICD-10-CM | POA: Diagnosis not present

## 2024-02-07 DIAGNOSIS — E1159 Type 2 diabetes mellitus with other circulatory complications: Secondary | ICD-10-CM | POA: Diagnosis not present

## 2024-02-07 DIAGNOSIS — H9193 Unspecified hearing loss, bilateral: Secondary | ICD-10-CM | POA: Diagnosis not present

## 2024-02-07 DIAGNOSIS — F03A3 Unspecified dementia, mild, with mood disturbance: Secondary | ICD-10-CM | POA: Diagnosis not present

## 2024-02-07 DIAGNOSIS — F331 Major depressive disorder, recurrent, moderate: Secondary | ICD-10-CM | POA: Diagnosis not present

## 2024-02-12 ENCOUNTER — Other Ambulatory Visit: Payer: Self-pay | Admitting: Family Medicine

## 2024-02-12 DIAGNOSIS — F03A3 Unspecified dementia, mild, with mood disturbance: Secondary | ICD-10-CM

## 2024-02-14 DIAGNOSIS — E1165 Type 2 diabetes mellitus with hyperglycemia: Secondary | ICD-10-CM | POA: Diagnosis not present

## 2024-02-14 DIAGNOSIS — R7989 Other specified abnormal findings of blood chemistry: Secondary | ICD-10-CM | POA: Diagnosis not present

## 2024-02-14 DIAGNOSIS — F03A3 Unspecified dementia, mild, with mood disturbance: Secondary | ICD-10-CM | POA: Diagnosis not present

## 2024-02-14 DIAGNOSIS — Z79899 Other long term (current) drug therapy: Secondary | ICD-10-CM | POA: Diagnosis not present

## 2024-02-14 DIAGNOSIS — R946 Abnormal results of thyroid function studies: Secondary | ICD-10-CM | POA: Diagnosis not present

## 2024-03-05 ENCOUNTER — Encounter: Payer: Self-pay | Admitting: Family Medicine

## 2024-03-06 ENCOUNTER — Encounter: Payer: Self-pay | Admitting: Family Medicine

## 2024-03-13 ENCOUNTER — Other Ambulatory Visit

## 2024-03-31 DIAGNOSIS — H353123 Nonexudative age-related macular degeneration, left eye, advanced atrophic without subfoveal involvement: Secondary | ICD-10-CM | POA: Diagnosis not present

## 2024-03-31 DIAGNOSIS — H903 Sensorineural hearing loss, bilateral: Secondary | ICD-10-CM | POA: Diagnosis not present

## 2024-03-31 DIAGNOSIS — H35352 Cystoid macular degeneration, left eye: Secondary | ICD-10-CM | POA: Diagnosis not present

## 2024-03-31 DIAGNOSIS — H353212 Exudative age-related macular degeneration, right eye, with inactive choroidal neovascularization: Secondary | ICD-10-CM | POA: Diagnosis not present

## 2024-03-31 DIAGNOSIS — H353114 Nonexudative age-related macular degeneration, right eye, advanced atrophic with subfoveal involvement: Secondary | ICD-10-CM | POA: Diagnosis not present

## 2024-03-31 DIAGNOSIS — H02833 Dermatochalasis of right eye, unspecified eyelid: Secondary | ICD-10-CM | POA: Diagnosis not present

## 2024-04-04 ENCOUNTER — Ambulatory Visit
Admission: RE | Admit: 2024-04-04 | Discharge: 2024-04-04 | Disposition: A | Discharge status: Home / Self Care | Source: Ambulatory Visit | Attending: Family Medicine | Admitting: Family Medicine

## 2024-04-04 DIAGNOSIS — F03A3 Unspecified dementia, mild, with mood disturbance: Secondary | ICD-10-CM

## 2024-04-04 MED ORDER — GADOPICLENOL 0.5 MMOL/ML IV SOLN
6.5000 mL | Freq: Once | INTRAVENOUS | Status: AC | PRN
  Administered 2024-04-04: 6.5 mL via INTRAVENOUS

## 2024-05-15 DIAGNOSIS — H35352 Cystoid macular degeneration, left eye: Secondary | ICD-10-CM | POA: Diagnosis not present

## 2024-05-15 DIAGNOSIS — H02833 Dermatochalasis of right eye, unspecified eyelid: Secondary | ICD-10-CM | POA: Diagnosis not present

## 2024-05-15 DIAGNOSIS — H353212 Exudative age-related macular degeneration, right eye, with inactive choroidal neovascularization: Secondary | ICD-10-CM | POA: Diagnosis not present

## 2024-05-15 DIAGNOSIS — H353114 Nonexudative age-related macular degeneration, right eye, advanced atrophic with subfoveal involvement: Secondary | ICD-10-CM | POA: Diagnosis not present

## 2024-05-15 DIAGNOSIS — H353123 Nonexudative age-related macular degeneration, left eye, advanced atrophic without subfoveal involvement: Secondary | ICD-10-CM | POA: Diagnosis not present

## 2024-05-20 DIAGNOSIS — R946 Abnormal results of thyroid function studies: Secondary | ICD-10-CM | POA: Diagnosis not present

## 2024-05-20 DIAGNOSIS — R634 Abnormal weight loss: Secondary | ICD-10-CM | POA: Diagnosis not present

## 2024-05-20 DIAGNOSIS — F03A3 Unspecified dementia, mild, with mood disturbance: Secondary | ICD-10-CM | POA: Diagnosis not present

## 2024-05-20 DIAGNOSIS — R7989 Other specified abnormal findings of blood chemistry: Secondary | ICD-10-CM | POA: Diagnosis not present

## 2024-05-20 DIAGNOSIS — F331 Major depressive disorder, recurrent, moderate: Secondary | ICD-10-CM | POA: Diagnosis not present

## 2024-06-26 DIAGNOSIS — H353123 Nonexudative age-related macular degeneration, left eye, advanced atrophic without subfoveal involvement: Secondary | ICD-10-CM | POA: Diagnosis not present

## 2024-06-26 DIAGNOSIS — H353114 Nonexudative age-related macular degeneration, right eye, advanced atrophic with subfoveal involvement: Secondary | ICD-10-CM | POA: Diagnosis not present

## 2024-06-26 DIAGNOSIS — H35352 Cystoid macular degeneration, left eye: Secondary | ICD-10-CM | POA: Diagnosis not present

## 2024-06-26 DIAGNOSIS — H02833 Dermatochalasis of right eye, unspecified eyelid: Secondary | ICD-10-CM | POA: Diagnosis not present

## 2024-06-26 DIAGNOSIS — H353212 Exudative age-related macular degeneration, right eye, with inactive choroidal neovascularization: Secondary | ICD-10-CM | POA: Diagnosis not present

## 2024-07-23 DIAGNOSIS — C4442 Squamous cell carcinoma of skin of scalp and neck: Secondary | ICD-10-CM | POA: Diagnosis not present

## 2024-07-24 DIAGNOSIS — Z0000 Encounter for general adult medical examination without abnormal findings: Secondary | ICD-10-CM | POA: Diagnosis not present
